# Patient Record
Sex: Male | Born: 1980 | Race: Black or African American | Hispanic: No | Marital: Married | Smoking: Never smoker
Health system: Southern US, Community
[De-identification: ages and names within clinical notes are randomized; demographics above are authoritative.]

## PROBLEM LIST (undated history)

## (undated) DIAGNOSIS — K5792 Diverticulitis of intestine, part unspecified, without perforation or abscess without bleeding: Secondary | ICD-10-CM

## (undated) HISTORY — PX: NOSE SURGERY: SHX723

---

## 2006-01-22 ENCOUNTER — Emergency Department: Payer: Self-pay | Admitting: Emergency Medicine

## 2006-01-22 ENCOUNTER — Other Ambulatory Visit: Payer: Self-pay

## 2006-11-03 ENCOUNTER — Emergency Department: Payer: Self-pay | Admitting: Unknown Physician Specialty

## 2007-08-11 ENCOUNTER — Emergency Department (HOSPITAL_COMMUNITY): Admission: EM | Admit: 2007-08-11 | Discharge: 2007-08-11 | Payer: Self-pay | Admitting: Emergency Medicine

## 2008-12-13 ENCOUNTER — Emergency Department: Payer: Self-pay | Admitting: Emergency Medicine

## 2009-04-14 ENCOUNTER — Emergency Department: Payer: Self-pay | Admitting: Emergency Medicine

## 2009-06-13 ENCOUNTER — Emergency Department: Payer: Self-pay | Admitting: Emergency Medicine

## 2009-08-16 ENCOUNTER — Emergency Department: Payer: Self-pay | Admitting: Emergency Medicine

## 2009-11-18 ENCOUNTER — Emergency Department: Payer: Self-pay | Admitting: Emergency Medicine

## 2010-02-02 ENCOUNTER — Emergency Department: Payer: Self-pay | Admitting: Internal Medicine

## 2010-06-12 ENCOUNTER — Emergency Department (HOSPITAL_COMMUNITY): Admission: EM | Admit: 2010-06-12 | Discharge: 2009-07-24 | Payer: Self-pay | Admitting: Emergency Medicine

## 2010-07-11 ENCOUNTER — Emergency Department: Payer: Self-pay | Admitting: Emergency Medicine

## 2011-01-30 ENCOUNTER — Emergency Department (HOSPITAL_COMMUNITY): Payer: Managed Care, Other (non HMO)

## 2011-01-30 ENCOUNTER — Emergency Department (HOSPITAL_COMMUNITY)
Admission: EM | Admit: 2011-01-30 | Discharge: 2011-01-30 | Disposition: A | Discharge status: Home / Self Care | Payer: Managed Care, Other (non HMO) | Attending: Emergency Medicine | Admitting: Emergency Medicine

## 2011-01-30 DIAGNOSIS — R002 Palpitations: Secondary | ICD-10-CM | POA: Insufficient documentation

## 2011-01-30 DIAGNOSIS — R0989 Other specified symptoms and signs involving the circulatory and respiratory systems: Secondary | ICD-10-CM | POA: Insufficient documentation

## 2011-01-30 DIAGNOSIS — E785 Hyperlipidemia, unspecified: Secondary | ICD-10-CM | POA: Insufficient documentation

## 2011-01-30 DIAGNOSIS — R0602 Shortness of breath: Secondary | ICD-10-CM | POA: Insufficient documentation

## 2011-01-30 DIAGNOSIS — R05 Cough: Secondary | ICD-10-CM | POA: Insufficient documentation

## 2011-01-30 DIAGNOSIS — R000 Tachycardia, unspecified: Secondary | ICD-10-CM | POA: Insufficient documentation

## 2011-01-30 DIAGNOSIS — R0609 Other forms of dyspnea: Secondary | ICD-10-CM | POA: Insufficient documentation

## 2011-01-30 DIAGNOSIS — Z79899 Other long term (current) drug therapy: Secondary | ICD-10-CM | POA: Insufficient documentation

## 2011-01-30 DIAGNOSIS — R059 Cough, unspecified: Secondary | ICD-10-CM | POA: Insufficient documentation

## 2011-01-30 DIAGNOSIS — F411 Generalized anxiety disorder: Secondary | ICD-10-CM | POA: Insufficient documentation

## 2011-01-30 LAB — POCT I-STAT, CHEM 8
BUN: 12 mg/dL (ref 6–23)
Chloride: 105 mEq/L (ref 96–112)
TCO2: 22 mmol/L (ref 0–100)

## 2011-01-30 LAB — DIFFERENTIAL
Eosinophils Relative: 2 % (ref 0–5)
Lymphocytes Relative: 46 % (ref 12–46)
Lymphs Abs: 2.5 10*3/uL (ref 0.7–4.0)
Neutrophils Relative %: 43 % (ref 43–77)

## 2011-01-30 LAB — CBC
Hemoglobin: 14 g/dL (ref 13.0–17.0)
MCV: 91.1 fL (ref 78.0–100.0)
Platelets: 227 10*3/uL (ref 150–400)
RBC: 4.26 MIL/uL (ref 4.22–5.81)
WBC: 5.5 10*3/uL (ref 4.0–10.5)

## 2011-01-30 LAB — TROPONIN I: Troponin I: <0.3 ng/mL (ref <0.30)

## 2011-03-27 LAB — POCT CARDIAC MARKERS
CKMB, poc: 2.4
Myoglobin, poc: 75.1
Myoglobin, poc: 83.3
Operator id: 294501
Troponin i, poc: <0.05

## 2011-03-27 LAB — DIFFERENTIAL
Monocytes Absolute: 0.5
Neutro Abs: 2
Neutrophils Relative %: 42 — ABNORMAL LOW

## 2011-03-27 LAB — CBC
HCT: 43.6
Platelets: 230
RDW: 12.1
WBC: 4.9

## 2011-03-27 LAB — POCT I-STAT CREATININE: Operator id: 272551

## 2011-03-27 LAB — I-STAT 8, (EC8 V) (CONVERTED LAB)
Chloride: 107
HCT: 46
Hemoglobin: 15.6
Operator id: 272551
Potassium: 3.7
TCO2: 26
pH, Ven: 7.367 — ABNORMAL HIGH

## 2011-03-27 LAB — D-DIMER, QUANTITATIVE: D-Dimer, Quant: <0.22

## 2011-05-15 ENCOUNTER — Emergency Department (HOSPITAL_COMMUNITY)
Admission: EM | Admit: 2011-05-15 | Discharge: 2011-05-15 | Disposition: A | Discharge status: Home / Self Care | Payer: Self-pay | Attending: Emergency Medicine | Admitting: Emergency Medicine

## 2011-05-15 ENCOUNTER — Other Ambulatory Visit: Payer: Self-pay

## 2011-05-15 ENCOUNTER — Emergency Department (HOSPITAL_COMMUNITY): Payer: Self-pay

## 2011-05-15 ENCOUNTER — Encounter: Payer: Self-pay | Admitting: Emergency Medicine

## 2011-05-15 DIAGNOSIS — R0789 Other chest pain: Secondary | ICD-10-CM

## 2011-05-15 DIAGNOSIS — R079 Chest pain, unspecified: Secondary | ICD-10-CM | POA: Insufficient documentation

## 2011-05-15 DIAGNOSIS — R42 Dizziness and giddiness: Secondary | ICD-10-CM | POA: Insufficient documentation

## 2011-05-15 LAB — CBC
Hemoglobin: 14.7 g/dL (ref 13.0–17.0)
MCH: 33.4 pg (ref 26.0–34.0)
MCV: 93.2 fL (ref 78.0–100.0)
RBC: 4.4 MIL/uL (ref 4.22–5.81)

## 2011-05-15 LAB — BASIC METABOLIC PANEL
CO2: 23 mEq/L (ref 19–32)
Calcium: 9.6 mg/dL (ref 8.4–10.5)
Creatinine, Ser: 0.74 mg/dL (ref 0.50–1.35)
Glucose, Bld: 94 mg/dL (ref 70–99)
Sodium: 134 mEq/L — ABNORMAL LOW (ref 135–145)

## 2011-05-15 LAB — CARDIAC PANEL(CRET KIN+CKTOT+MB+TROPI)
CK, MB: 2.4 ng/mL (ref 0.3–4.0)
CK, MB: 2.3 ng/mL (ref 0.3–4.0)
Total CK: 493 U/L — ABNORMAL HIGH (ref 7–232)

## 2011-05-15 MED ORDER — GI COCKTAIL ~~LOC~~
30.0000 mL | Freq: Once | ORAL | Status: AC
  Administered 2011-05-15: 30 mL via ORAL
  Filled 2011-05-15: qty 30

## 2011-05-15 NOTE — ED Notes (Signed)
MD at bedside. 

## 2011-05-15 NOTE — ED Provider Notes (Signed)
History     CSN: [id number] Arrival date & time: 05/15/2011 12:42 AM   First MD Initiated Contact with Patient 05/15/11 0120      Chief Complaint  Patient presents with  . Chest Pain    (Consider location/radiation/quality/duration/timing/severity/associated sxs/prior treatment) HPI 30 year old male presents to the emergency department with complaint of intermittent chest pressure since 4 PM today. Patient denies shortness of breath or nausea with the pain but does occasionally feel clammy and lightheaded. Symptoms last no more than 3-4 minutes at a time and then resolve. No associated activity with the pain no prior history of same. Patient denies any family history of cardiac disease. Patient is nonsmoker. No leg swelling tachycardia prolonged immobility.  No past medical history on file.  Past Surgical History  Procedure Date  . Nose surgery     History reviewed. No pertinent family history.  History  Substance Use Topics  . Smoking status: Never Smoker   . Smokeless tobacco: Not on file  . Alcohol Use: No      Review of Systems  All other systems reviewed and are negative.    Allergies  Ivp dye  Home Medications   Current Outpatient Rx  Name Route Sig Dispense Refill  . VITAMIN B-12 1000 MCG/15ML PO LIQD Oral Take 30 mLs by mouth once.      Carma Leaven M PLUS PO TABS Oral Take 1 tablet by mouth daily.        BP 143/80  Temp(Src) 98 F (36.7 C) (Oral)  Resp 20  SpO2 100%  Physical Exam  Nursing note and vitals reviewed. Constitutional: He is oriented to person, place, and time. He appears well-developed and well-nourished.  HENT:  Head: Normocephalic and atraumatic.  Right Ear: External ear normal.  Left Ear: External ear normal.  Nose: Nose normal.  Mouth/Throat: Oropharynx is clear and moist.  Eyes: Conjunctivae and EOM are normal. Pupils are equal, round, and reactive to light.  Neck: Normal range of motion. Neck supple. No JVD present. No tracheal  deviation present. No thyromegaly present.  Cardiovascular: Normal rate, regular rhythm, normal heart sounds and intact distal pulses.  Exam reveals no gallop and no friction rub.   No murmur heard. Pulmonary/Chest: Effort normal and breath sounds normal. No stridor. No respiratory distress. He has no wheezes. He has no rales. He exhibits no tenderness.  Abdominal: Soft. Bowel sounds are normal. He exhibits no distension and no mass. There is no tenderness. There is no rebound and no guarding.  Musculoskeletal: Normal range of motion. He exhibits no edema and no tenderness.  Lymphadenopathy:    He has no cervical adenopathy.  Neurological: He is oriented to person, place, and time. He has normal reflexes. No cranial nerve deficit. He exhibits normal muscle tone. Coordination normal.  Skin: Skin is dry. No rash noted. No erythema. No pallor.  Psychiatric: He has a normal mood and affect. His behavior is normal. Judgment and thought content normal.    ED Course  Procedures (including critical care time)  Labs Reviewed  BASIC METABOLIC PANEL - Abnormal; Notable for the following:    Sodium 134 (*)    All other components within normal limits  CARDIAC PANEL(CRET KIN+CKTOT+MB+TROPI) - Abnormal; Notable for the following:    Total CK 525 (*)    All other components within normal limits  CBC  CARDIAC PANEL(CRET KIN+CKTOT+MB+TROPI)   Dg Chest 2 View  05/15/2011  *RADIOLOGY REPORT*  Clinical Data: Chest pain  CHEST - 2 VIEW  Comparison: 01/30/2011  Findings: Normal heart size.  Clear lungs. Stable thoracic spine.  IMPRESSION: No active cardiopulmonary disease.  Original Report Authenticated By: Donavan Burnet, M.D.    Results for orders placed during the hospital encounter of 05/15/11  CBC      Component Value Range   WBC 5.5  4.0 - 10.5 (K/uL)   RBC 4.40  4.22 - 5.81 (MIL/uL)   Hemoglobin 14.7  13.0 - 17.0 (g/dL)   HCT 16.1  09.6 - 04.5 (%)   MCV 93.2  78.0 - 100.0 (fL)   MCH 33.4  26.0  - 34.0 (pg)   MCHC 35.9  30.0 - 36.0 (g/dL)   RDW 40.9  81.1 - 91.4 (%)   Platelets 253  150 - 400 (K/uL)  BASIC METABOLIC PANEL      Component Value Range   Sodium 134 (*) 135 - 145 (mEq/L)   Potassium 3.9  3.5 - 5.1 (mEq/L)   Chloride 100  96 - 112 (mEq/L)   CO2 23  19 - 32 (mEq/L)   Glucose, Bld 94  70 - 99 (mg/dL)   BUN 11  6 - 23 (mg/dL)   Creatinine, Ser 7.82  0.50 - 1.35 (mg/dL)   Calcium 9.6  8.4 - 95.6 (mg/dL)   GFR calc non Af Amer >90  >90 (mL/min)   GFR calc Af Amer >90  >90 (mL/min)  CARDIAC PANEL(CRET KIN+CKTOT+MB+TROPI)      Component Value Range   Total CK 525 (*) 7 - 232 (U/L)   CK, MB 2.4  0.3 - 4.0 (ng/mL)   Troponin I <0.30  <0.30 (ng/mL)   Relative Index 0.5  0.0 - 2.5   CARDIAC PANEL(CRET KIN+CKTOT+MB+TROPI)      Component Value Range   Total CK 493 (*) 7 - 232 (U/L)   CK, MB 2.3  0.3 - 4.0 (ng/mL)   Troponin I <0.30  <0.30 (ng/mL)   Relative Index 0.5  0.0 - 2.5    Dg Chest 2 View  05/15/2011  *RADIOLOGY REPORT*  Clinical Data: Chest pain  CHEST - 2 VIEW  Comparison: 01/30/2011  Findings: Normal heart size.  Clear lungs. Stable thoracic spine.  IMPRESSION: No active cardiopulmonary disease.  Original Report Authenticated By: Donavan Burnet, M.D.     Date: 05/15/2011  Rate: 71   Rhythm: normal sinus rhythm  QRS Axis: normal  Intervals: normal  ST/T Wave abnormalities: t wave inversions  Conduction Disutrbances:none  Narrative Interpretation: flipped t wave in III, AVF, unchanged from prior  Old EKG Reviewed: unchanged     MDM  30 year old male with one day of intermittent chest pain. No EKG changes, elevated total CK but negative MB and troponin. Discuss with on-call cardiology who does not feel that this is indication for admission. Patient's story seems atypical for cardiac disease. Will provide him with primary care physician information for further followup        Olivia Mackie, MD 05/15/11 [phone]

## 2011-05-15 NOTE — ED Notes (Signed)
Pt states he has been having chest pain off and on all day  Pt states it is a heaviness and feels like someone is pressing on his chest  Pt states the pain comes and goes  Pt states earlier he had some light headedness and a cold clammy sweat with the pain

## 2011-05-15 NOTE — ED Notes (Signed)
Patient states that he has had intermittent chest pain since 330pm. States that he went to an EMS substation and they did an EKG which they told him was negative but advised him that he may need to have blood work done. Patient states that pain has persisted intermittently since EKG was done. States that pain gets worse with activity and subsides with rest. Patient indicates left side chest pain. States "it feels like the pain is behind my heart." Patient denies chest pain at this time.

## 2011-05-15 NOTE — ED Notes (Signed)
Patient ambulated to bathroom; gait steady. Patient denies chest pain after ambulation.

## 2011-05-24 ENCOUNTER — Emergency Department: Payer: Self-pay | Admitting: Emergency Medicine

## 2011-06-04 ENCOUNTER — Emergency Department: Payer: Self-pay | Admitting: Emergency Medicine

## 2011-12-02 ENCOUNTER — Emergency Department: Payer: Self-pay | Admitting: Emergency Medicine

## 2011-12-02 LAB — URINALYSIS, COMPLETE
Bacteria: NONE SEEN
Bilirubin,UR: NEGATIVE
Blood: NEGATIVE
Glucose,UR: NEGATIVE mg/dL (ref 0–75)
Leukocyte Esterase: NEGATIVE
Ph: 6 (ref 4.5–8.0)
Specific Gravity: 1.018 (ref 1.003–1.030)
Squamous Epithelial: <1
WBC UR: NONE SEEN /HPF (ref 0–5)

## 2011-12-02 LAB — DRUG SCREEN, URINE
Cocaine Metabolite,Ur ~~LOC~~: NEGATIVE (ref ?–300)
Opiate, Ur Screen: NEGATIVE (ref ?–300)
Phencyclidine (PCP) Ur S: NEGATIVE (ref ?–25)
Tricyclic, Ur Screen: NEGATIVE (ref ?–1000)

## 2011-12-02 LAB — COMPREHENSIVE METABOLIC PANEL
Anion Gap: 9 (ref 7–16)
Calcium, Total: 8.7 mg/dL (ref 8.5–10.1)
EGFR (African American): >60
Glucose: 105 mg/dL — ABNORMAL HIGH (ref 65–99)
Osmolality: 279 (ref 275–301)
Potassium: 3.9 mmol/L (ref 3.5–5.1)
SGPT (ALT): 53 U/L
Sodium: 140 mmol/L (ref 136–145)

## 2011-12-02 LAB — CBC
HCT: 40.3 % (ref 40.0–52.0)
HGB: 14.1 g/dL (ref 13.0–18.0)
MCH: 33.3 pg (ref 26.0–34.0)
MCHC: 35.1 g/dL (ref 32.0–36.0)
Platelet: 224 10*3/uL (ref 150–440)
RBC: 4.24 10*6/uL — ABNORMAL LOW (ref 4.40–5.90)
RDW: 12.5 % (ref 11.5–14.5)
WBC: 4.2 10*3/uL (ref 3.8–10.6)

## 2011-12-02 LAB — CK TOTAL AND CKMB (NOT AT ARMC)
CK, Total: 989 U/L — ABNORMAL HIGH (ref 35–232)
CK-MB: 1.5 ng/mL (ref 0.5–3.6)

## 2012-06-16 IMAGING — CT CT ABD-PELV W/O CM
1 of 2 series · 15 of 32 positions shown, 20 images · non-contrast
Comparison: none

REASON FOR EXAM: (1) RLQ pain, vomiting and fever; (2) RLQ pain, vomiting
and fever;    NOTE: Essie
COMMENTS:

PROCEDURE:     CT  - CT ABDOMEN AND PELVIS W[DATE]  [DATE]
RESULT:
TECHNIQUE: Helical noncontrast 3 mm sections were obtained from the lung
bases through the pubic symphysis.

[Series 2: 3mm soft tissue · axial · 0.83mm/px · z∈[-400,+80]mm · 15 of 176 slices shown, 20 images]
[im 8/176  soft-tissue]
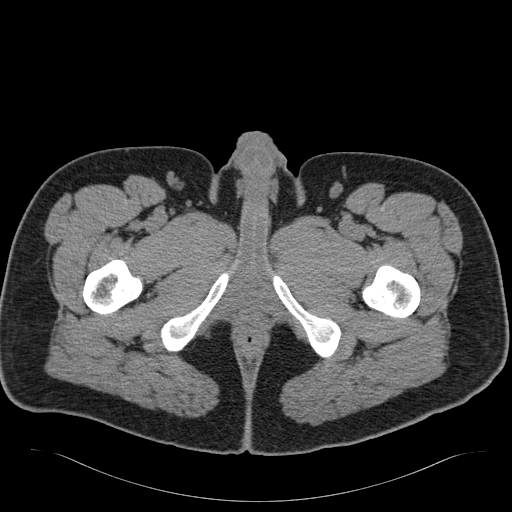
[im 8/176  bone]
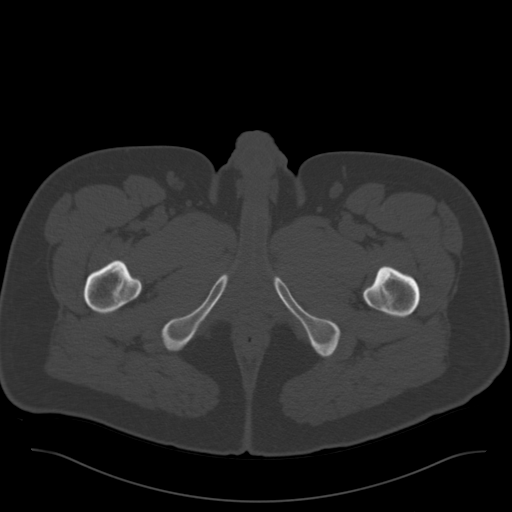
[im 23/176  soft-tissue]
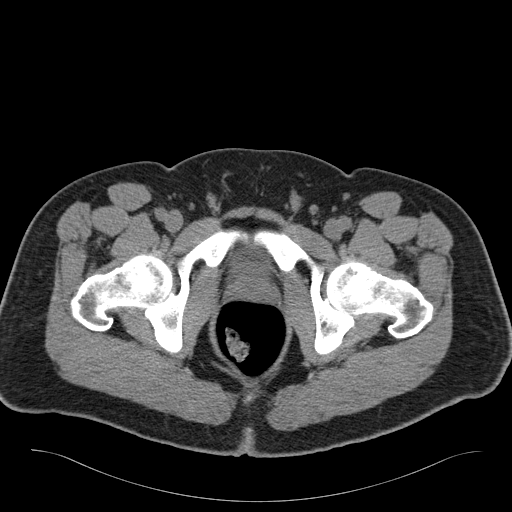
[im 31/176  soft-tissue]
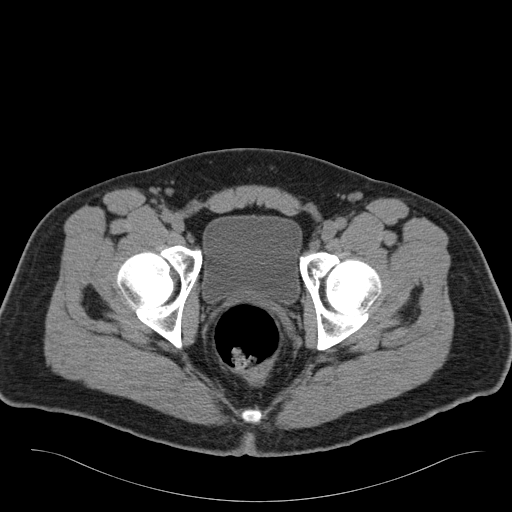
[im 46/176  soft-tissue]
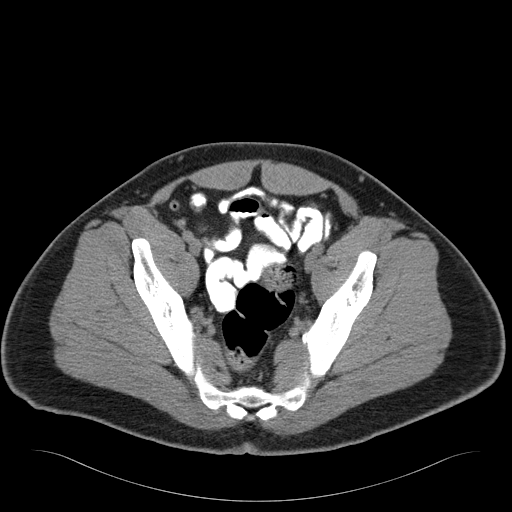
[im 61/176  soft-tissue]
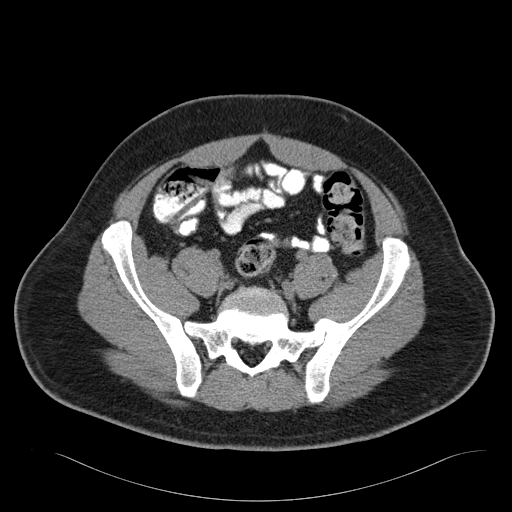
[im 69/176  soft-tissue]
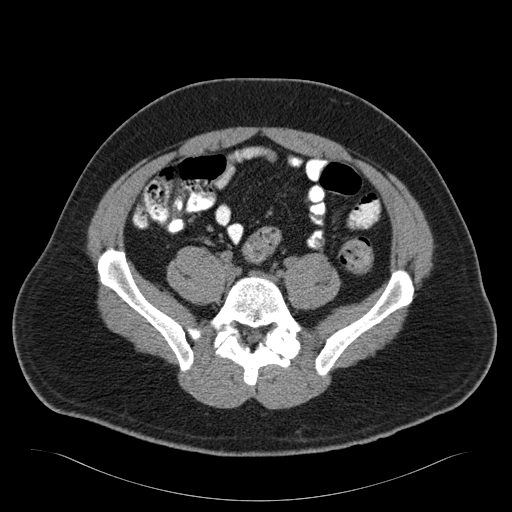
[im 84/176  soft-tissue]
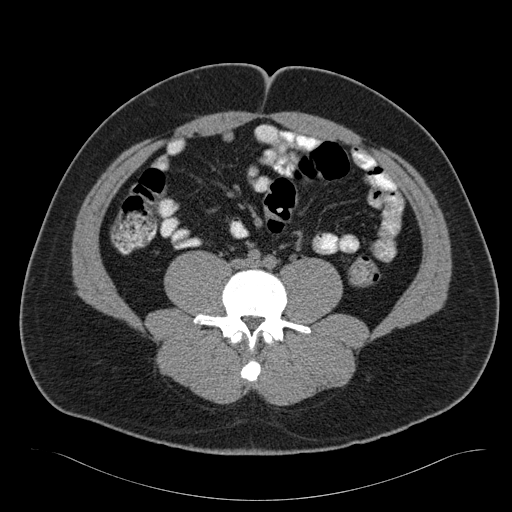
[im 92/176  soft-tissue]
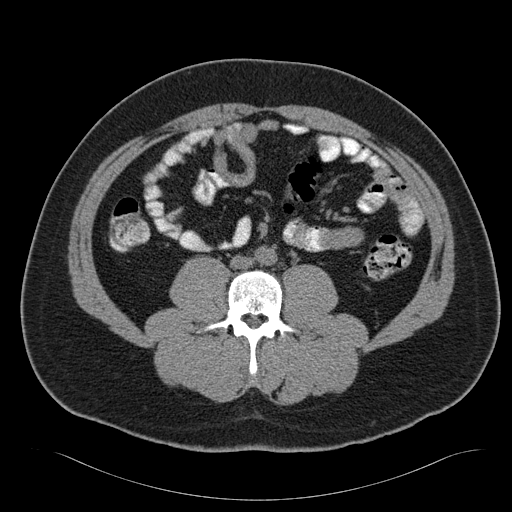
[im 107/176  soft-tissue]
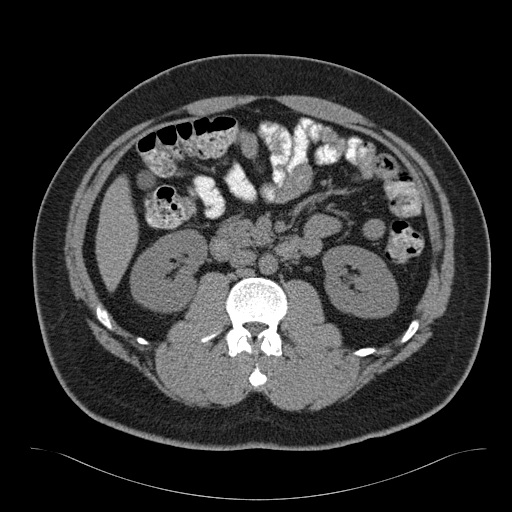
[im 107/176  bone]
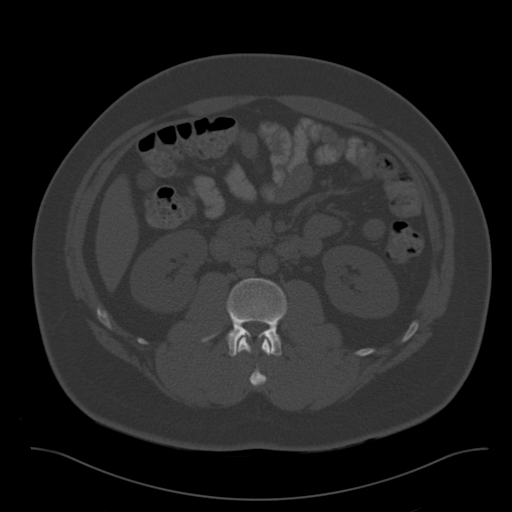
[im 115/176  soft-tissue]
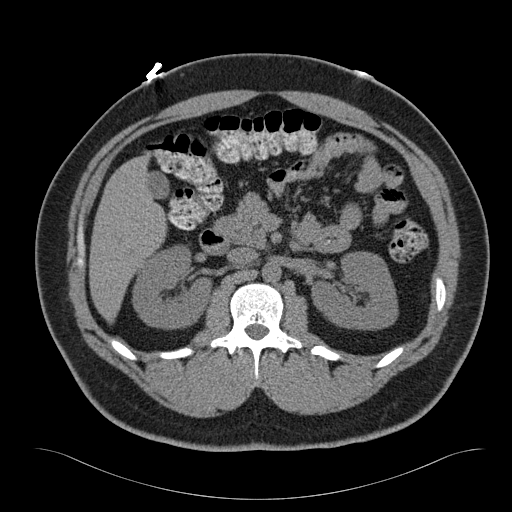
[im 130/176  soft-tissue]
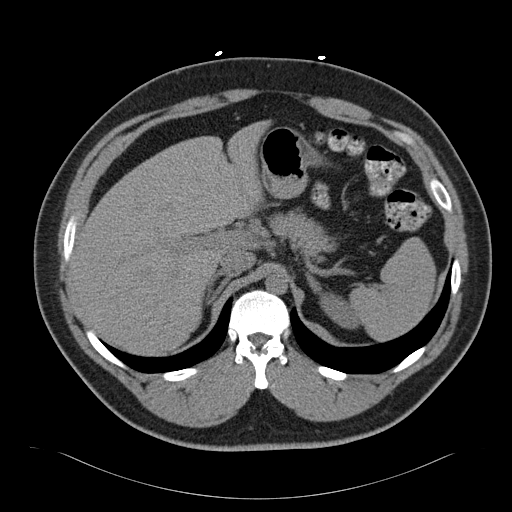
[im 145/176  soft-tissue]
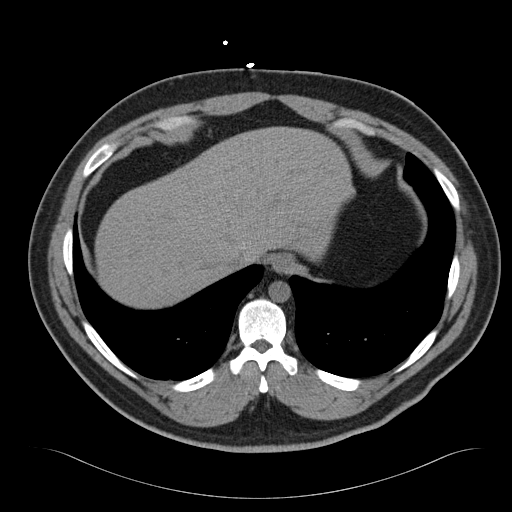
[im 145/176  lung]
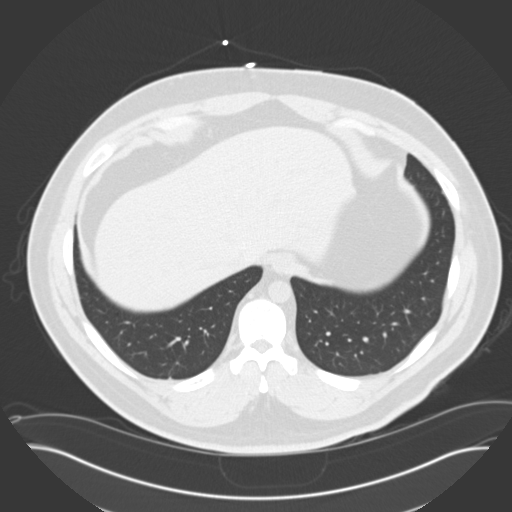
[im 153/176  soft-tissue]
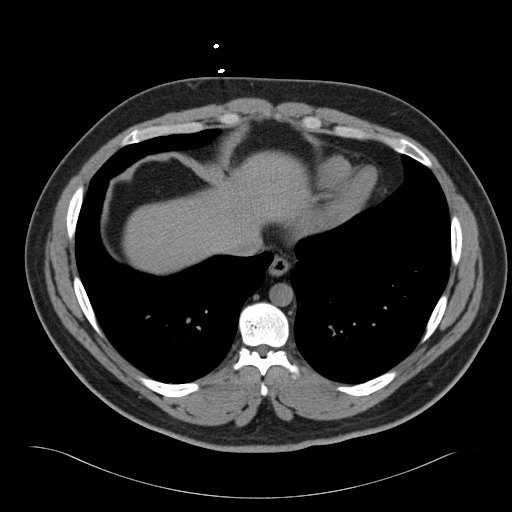
[im 153/176  lung]
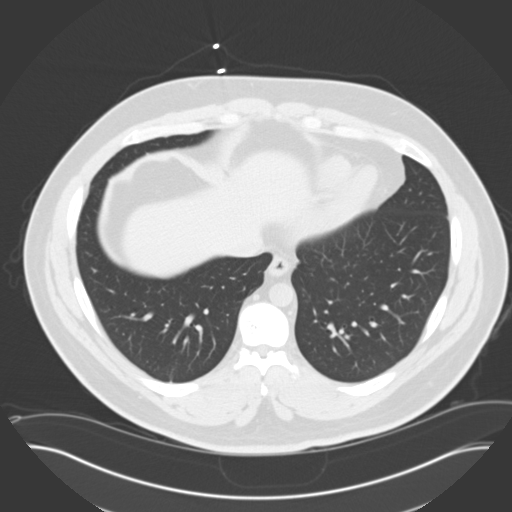
[im 160/176  lung]
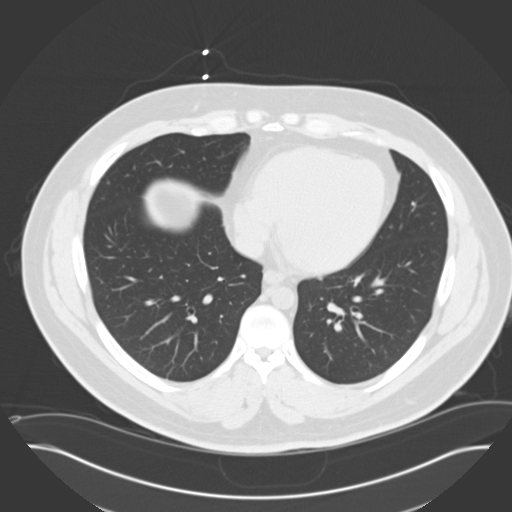
[im 168/176  soft-tissue]
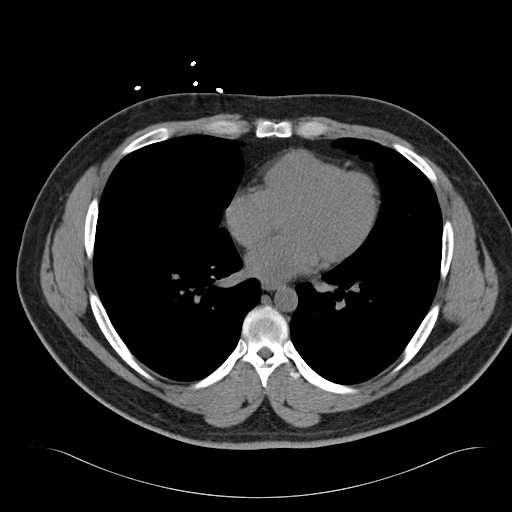
[im 168/176  lung]
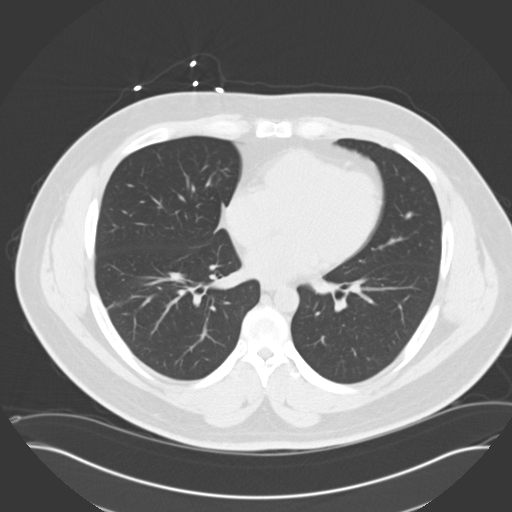

[15 of 32 positions shown; findings below may reference images not displayed]

FINDINGS: The lung bases are unremarkable.

The liver, spleen, adrenals, pancreas and kidneys are unremarkable. There is
no evidence of abdominal free fluid, loculated fluid collections, masses or
adenopathy. The appendix is appreciated and is unremarkable.
IMPRESSION: 1.     No CT evidence of obstructive or inflammatory abnormalities.
2.     Dr. Psit[place]otopoula-Gr of the Emergency Department was informed of these findings
via a preliminary faxed report.

## 2019-12-28 ENCOUNTER — Ambulatory Visit
Admission: EM | Admit: 2019-12-28 | Discharge: 2019-12-28 | Disposition: A | Discharge status: Home / Self Care | Payer: Self-pay | Attending: Emergency Medicine | Admitting: Emergency Medicine

## 2019-12-28 ENCOUNTER — Other Ambulatory Visit: Payer: Self-pay

## 2019-12-28 DIAGNOSIS — R1012 Left upper quadrant pain: Secondary | ICD-10-CM

## 2019-12-28 LAB — POCT URINALYSIS DIP (MANUAL ENTRY)
Bilirubin, UA: NEGATIVE
Glucose, UA: NEGATIVE mg/dL
Ketones, POC UA: NEGATIVE mg/dL
Leukocytes, UA: NEGATIVE
Nitrite, UA: NEGATIVE
Protein Ur, POC: NEGATIVE mg/dL
Spec Grav, UA: 1.015 (ref 1.010–1.025)
Urobilinogen, UA: 0.2 E.U./dL
pH, UA: 7 (ref 5.0–8.0)

## 2019-12-28 NOTE — ED Triage Notes (Signed)
Pt c/o constant center lower abdominal pain and pressure since yesterday around 6pm. States developed chills last night with no appetite. Denies n/v/d

## 2019-12-28 NOTE — Discharge Instructions (Signed)
Will call with lab results tomorrow morning. In the meantime, please go to ER for worsening pain, vomiting, fever, blood in stool/urine.

## 2019-12-28 NOTE — ED Provider Notes (Signed)
[facility]    CSN: [id number] Arrival date & time: 12/28/19  1641      History   Chief Complaint Chief Complaint  Patient presents with  . Abdominal Pain    HPI Caleb Manning is a 39 y.o. male that significant medical history presenting for constant, lower abdominal pain with pressure since yesterday.  States it began around 6 PM.  Also developed chills later that night and has had decreased appetite.  No nausea, vomiting, diarrhea.  No fever, blood in stool, melena, diarrhea, change in urination.  No penile or testicular pain or swelling.  Denies cough, difficulty breathing, chest pain.  No history of diverticulosis/diverticulitis.  No rectal pain.  Last bowel movement this morning: Normal for him.  Denies abdominal surgery history.  No easy bruising or bleeding.  History reviewed. No pertinent past medical history.  There are no problems to display for this patient.   Past Surgical History:  Procedure Laterality Date  . NOSE SURGERY         Home Medications    Prior to Admission medications   Medication Sig Start Date End Date Taking? Authorizing Provider  Cyanocobalamin (VITAMIN B-12) 1000 MCG/15ML LIQD Take 30 mLs by mouth once.      [provider]  Multiple Vitamins-Minerals (MULTIVITAMINS THER. W/MINERALS) TABS Take 1 tablet by mouth daily.      [provider]    Family History History reviewed. No pertinent family history.  Social History Social History   Tobacco Use  . Smoking status: Never Smoker  . Smokeless tobacco: Never Used  Substance Use Topics  . Alcohol use: No  . Drug use: No     Allergies   Ivp dye [iodinated diagnostic agents]   Review of Systems As per HPI   Physical Exam Triage Vital Signs ED Triage Vitals  Enc Vitals Group     BP      Pulse      Resp      Temp      Temp src      SpO2      Weight      Height      Head Circumference      Peak Flow      Pain Score      Pain Loc       Pain Edu?      Excl. in [place]?    No data found.  Updated Vital Signs BP (!) 138/92 (BP Location: Left Arm)   Pulse (!) 106   Temp 99.4 F (37.4 C) (Oral)   Resp 18   SpO2 98%   Visual Acuity Right Eye Distance:   Left Eye Distance:   Bilateral Distance:    Right Eye Near:   Left Eye Near:    Bilateral Near:     Physical Exam Constitutional:      General: He is not in acute distress.    Appearance: He is well-developed. He is obese. He is not ill-appearing.  HENT:     Head: Normocephalic and atraumatic.  Eyes:     General: No scleral icterus.    Pupils: Pupils are equal, round, and reactive to light.  Cardiovascular:     Rate and Rhythm: Normal rate and regular rhythm.  Pulmonary:     Effort: Pulmonary effort is normal. No respiratory distress.     Breath sounds: No wheezing.  Abdominal:     General: Bowel sounds are normal. There is no distension or  abdominal bruit.     Palpations: Abdomen is soft. There is no hepatomegaly or splenomegaly.     Tenderness: There is abdominal tenderness in the left upper quadrant. There is no right CVA tenderness, left CVA tenderness, guarding or rebound. Negative signs include Murphy's sign, Rovsing's sign and McBurney's sign.  Skin:    General: Skin is warm.     Capillary Refill: Capillary refill takes less than 2 seconds.     Coloration: Skin is not cyanotic, jaundiced, mottled or pale.     Findings: No erythema or rash.  Neurological:     General: No focal deficit present.     Mental Status: He is alert and oriented to person, place, and time.      UC Treatments / Results  Labs (all labs ordered are listed, but only abnormal results are displayed) Labs Reviewed  POCT URINALYSIS DIP (MANUAL ENTRY) - Abnormal; Notable for the following components:      Result Value   Blood, UA small (*)    All other components within normal limits  AMYLASE  LIPID PANEL  COMPREHENSIVE METABOLIC PANEL  CBC WITH DIFFERENTIAL/PLATELET     EKG   Radiology No results found.  Procedures Procedures (including critical care time)  Medications Ordered in UC Medications - No data to display  Initial Impression / Assessment and Plan / UC Course  I have reviewed the triage vital signs and the nursing notes.  Pertinent labs & imaging results that were available during my care of the patient were reviewed by me and considered in my medical decision making (see chart for details).     Patient afebrile, nontoxic in office today.  Patient does have LUQ tenderness, though no history of colitis, diverticulosis/diverticulitis.  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  No signs of acute abdomen; will obtain basic lab screening for infectious process, hepatic or renal injury, pancreatic injury.  ER return precautions discussed, patient verbalized understanding and is agreeable to plan. Final Clinical Impressions(s) / UC Diagnoses   Final diagnoses:  Acute LUQ pain     Discharge Instructions     Will call with lab results tomorrow morning. In the meantime, please go to ER for worsening pain, vomiting, fever, blood in stool/urine.    ED Prescriptions    None     PDMP not reviewed this encounter.   Hall-Potvin, [place], [place] 12/28/19 1958

## 2019-12-29 ENCOUNTER — Emergency Department (HOSPITAL_COMMUNITY)
Admission: EM | Admit: 2019-12-29 | Discharge: 2019-12-30 | Disposition: A | Discharge status: Home / Self Care | Payer: Self-pay | Attending: Emergency Medicine | Admitting: Emergency Medicine

## 2019-12-29 ENCOUNTER — Other Ambulatory Visit: Payer: Self-pay

## 2019-12-29 DIAGNOSIS — Z5321 Procedure and treatment not carried out due to patient leaving prior to being seen by health care provider: Secondary | ICD-10-CM | POA: Insufficient documentation

## 2019-12-29 DIAGNOSIS — R509 Fever, unspecified: Secondary | ICD-10-CM | POA: Insufficient documentation

## 2019-12-29 DIAGNOSIS — R350 Frequency of micturition: Secondary | ICD-10-CM | POA: Insufficient documentation

## 2019-12-29 DIAGNOSIS — R1030 Lower abdominal pain, unspecified: Secondary | ICD-10-CM | POA: Insufficient documentation

## 2019-12-29 LAB — COMPREHENSIVE METABOLIC PANEL
ALT: 36 IU/L (ref 0–44)
ALT: 54 U/L — ABNORMAL HIGH (ref 0–44)
AST: 23 IU/L (ref 0–40)
AST: 37 U/L (ref 15–41)
Albumin/Globulin Ratio: 1.4 (ref 1.2–2.2)
Albumin: 4.6 g/dL (ref 4.0–5.0)
Albumin: 4.2 g/dL (ref 3.5–5.0)
Alkaline Phosphatase: 72 IU/L (ref 48–121)
Alkaline Phosphatase: 60 U/L (ref 38–126)
Anion gap: 11 (ref 5–15)
BUN/Creatinine Ratio: 8 — ABNORMAL LOW (ref 9–20)
BUN: 8 mg/dL (ref 6–24)
BUN: 8 mg/dL (ref 6–20)
Bilirubin Total: 1.1 mg/dL (ref 0.0–1.2)
CO2: 22 mmol/L (ref 20–29)
CO2: 24 mmol/L (ref 22–32)
Calcium: 9.5 mg/dL (ref 8.7–10.2)
Calcium: 9.3 mg/dL (ref 8.9–10.3)
Chloride: 100 mmol/L (ref 96–106)
Chloride: 101 mmol/L (ref 98–111)
Creatinine, Ser: 0.96 mg/dL (ref 0.76–1.27)
Creatinine, Ser: 1.07 mg/dL (ref 0.61–1.24)
GFR calc Af Amer: 114 mL/min/{1.73_m2} (ref >59)
GFR calc Af Amer: >60 mL/min (ref >60)
GFR calc non Af Amer: 98 mL/min/{1.73_m2} (ref >59)
GFR calc non Af Amer: >60 mL/min (ref >60)
Globulin, Total: 3.3 g/dL (ref 1.5–4.5)
Glucose, Bld: 128 mg/dL — ABNORMAL HIGH (ref 70–99)
Glucose: 85 mg/dL (ref 65–99)
Potassium: 4.2 mmol/L (ref 3.5–5.2)
Potassium: 4 mmol/L (ref 3.5–5.1)
Sodium: 137 mmol/L (ref 134–144)
Sodium: 136 mmol/L (ref 135–145)
Total Bilirubin: 1.8 mg/dL — ABNORMAL HIGH (ref 0.3–1.2)
Total Protein: 7.9 g/dL (ref 6.0–8.5)
Total Protein: 7.5 g/dL (ref 6.5–8.1)

## 2019-12-29 LAB — URINALYSIS, ROUTINE W REFLEX MICROSCOPIC
Bacteria, UA: NONE SEEN
Bilirubin Urine: NEGATIVE
Glucose, UA: NEGATIVE mg/dL
Ketones, ur: NEGATIVE mg/dL
Leukocytes,Ua: NEGATIVE
Nitrite: NEGATIVE
Protein, ur: NEGATIVE mg/dL
Specific Gravity, Urine: 1.008 (ref 1.005–1.030)
pH: 8 (ref 5.0–8.0)

## 2019-12-29 LAB — LIPID PANEL
Chol/HDL Ratio: 5 ratio (ref 0.0–5.0)
Cholesterol, Total: 269 mg/dL — ABNORMAL HIGH (ref 100–199)
HDL: 54 mg/dL (ref >39)
LDL Chol Calc (NIH): 193 mg/dL — ABNORMAL HIGH (ref 0–99)
Triglycerides: 122 mg/dL (ref 0–149)
VLDL Cholesterol Cal: 22 mg/dL (ref 5–40)

## 2019-12-29 LAB — CBC WITH DIFFERENTIAL/PLATELET
Basophils Absolute: 0.1 10*3/uL (ref 0.0–0.2)
Basos: 1 %
EOS (ABSOLUTE): 0.1 10*3/uL (ref 0.0–0.4)
Eos: 1 %
Hematocrit: 44 % (ref 37.5–51.0)
Hemoglobin: 15.6 g/dL (ref 13.0–17.7)
Immature Grans (Abs): 0 10*3/uL (ref 0.0–0.1)
Immature Granulocytes: 0 %
Lymphocytes Absolute: 1.7 10*3/uL (ref 0.7–3.1)
Lymphs: 14 %
MCH: 33.8 pg — ABNORMAL HIGH (ref 26.6–33.0)
MCHC: 35.5 g/dL (ref 31.5–35.7)
MCV: 95 fL (ref 79–97)
Monocytes Absolute: 1.1 10*3/uL — ABNORMAL HIGH (ref 0.1–0.9)
Monocytes: 9 %
Neutrophils Absolute: 9.5 10*3/uL — ABNORMAL HIGH (ref 1.4–7.0)
Neutrophils: 75 %
Platelets: 251 10*3/uL (ref 150–450)
RBC: 4.62 x10E6/uL (ref 4.14–5.80)
RDW: 12.2 % (ref 11.6–15.4)
WBC: 12.5 10*3/uL — ABNORMAL HIGH (ref 3.4–10.8)

## 2019-12-29 LAB — CBC
HCT: 43 % (ref 39.0–52.0)
Hemoglobin: 15.1 g/dL (ref 13.0–17.0)
MCH: 33.5 pg (ref 26.0–34.0)
MCHC: 35.1 g/dL (ref 30.0–36.0)
MCV: 95.3 fL (ref 80.0–100.0)
Platelets: 246 10*3/uL (ref 150–400)
RBC: 4.51 MIL/uL (ref 4.22–5.81)
RDW: 11.7 % (ref 11.5–15.5)
WBC: 12.3 10*3/uL — ABNORMAL HIGH (ref 4.0–10.5)
nRBC: 0 % (ref 0.0–0.2)

## 2019-12-29 LAB — AMYLASE: Amylase: 71 U/L (ref 31–110)

## 2019-12-29 LAB — LIPASE, BLOOD: Lipase: 23 U/L (ref 11–51)

## 2019-12-29 MED ORDER — SODIUM CHLORIDE 0.9% FLUSH: 3.0000 mL | Freq: Once | INTRAVENOUS | Status: DC

## 2019-12-29 NOTE — ED Triage Notes (Signed)
Pt here from UC with lower abd pain/pressure and chills X2 days. Endorses fevers. Endorses frequent urination. Pt sent here to rule out appendicitis.

## 2019-12-30 ENCOUNTER — Emergency Department: Payer: BLUE CROSS/BLUE SHIELD

## 2019-12-30 ENCOUNTER — Other Ambulatory Visit: Payer: Self-pay

## 2019-12-30 ENCOUNTER — Encounter: Payer: Self-pay | Admitting: Intensive Care

## 2019-12-30 ENCOUNTER — Inpatient Hospital Stay
Admission: EM | Admit: 2019-12-30 | Discharge: 2020-01-01 | DRG: 392 | Disposition: A | Discharge status: Home / Self Care | Payer: BLUE CROSS/BLUE SHIELD | Attending: General Surgery | Admitting: General Surgery

## 2019-12-30 DIAGNOSIS — Z20822 Contact with and (suspected) exposure to covid-19: Secondary | ICD-10-CM | POA: Diagnosis present

## 2019-12-30 DIAGNOSIS — K5720 Diverticulitis of large intestine with perforation and abscess without bleeding: Secondary | ICD-10-CM

## 2019-12-30 DIAGNOSIS — D72829 Elevated white blood cell count, unspecified: Secondary | ICD-10-CM | POA: Diagnosis present

## 2019-12-30 DIAGNOSIS — K5792 Diverticulitis of intestine, part unspecified, without perforation or abscess without bleeding: Principal | ICD-10-CM | POA: Diagnosis present

## 2019-12-30 LAB — HIV ANTIBODY (ROUTINE TESTING W REFLEX): HIV Screen 4th Generation wRfx: NONREACTIVE

## 2019-12-30 LAB — URINALYSIS, COMPLETE (UACMP) WITH MICROSCOPIC
Bacteria, UA: NONE SEEN
Bilirubin Urine: NEGATIVE
Glucose, UA: NEGATIVE mg/dL
Ketones, ur: NEGATIVE mg/dL
Leukocytes,Ua: NEGATIVE
Nitrite: NEGATIVE
Protein, ur: NEGATIVE mg/dL
Specific Gravity, Urine: 1.008 (ref 1.005–1.030)
Squamous Epithelial / HPF: NONE SEEN (ref 0–5)
pH: 7 (ref 5.0–8.0)

## 2019-12-30 LAB — COMPREHENSIVE METABOLIC PANEL
ALT: 84 U/L — ABNORMAL HIGH (ref 0–44)
AST: 57 U/L — ABNORMAL HIGH (ref 15–41)
Albumin: 4.5 g/dL (ref 3.5–5.0)
Alkaline Phosphatase: 70 U/L (ref 38–126)
Anion gap: 11 (ref 5–15)
BUN: 11 mg/dL (ref 6–20)
CO2: 22 mmol/L (ref 22–32)
Calcium: 8.8 mg/dL — ABNORMAL LOW (ref 8.9–10.3)
Chloride: 100 mmol/L (ref 98–111)
Creatinine, Ser: 1.11 mg/dL (ref 0.61–1.24)
GFR calc Af Amer: >60 mL/min (ref >60)
GFR calc non Af Amer: >60 mL/min (ref >60)
Glucose, Bld: 137 mg/dL — ABNORMAL HIGH (ref 70–99)
Potassium: 4 mmol/L (ref 3.5–5.1)
Sodium: 133 mmol/L — ABNORMAL LOW (ref 135–145)
Total Bilirubin: 3 mg/dL — ABNORMAL HIGH (ref 0.3–1.2)
Total Protein: 8.4 g/dL — ABNORMAL HIGH (ref 6.5–8.1)

## 2019-12-30 LAB — CBC
HCT: 40.8 % (ref 39.0–52.0)
Hemoglobin: 14.8 g/dL (ref 13.0–17.0)
MCH: 33.6 pg (ref 26.0–34.0)
MCHC: 36.3 g/dL — ABNORMAL HIGH (ref 30.0–36.0)
MCV: 92.5 fL (ref 80.0–100.0)
Platelets: 244 10*3/uL (ref 150–400)
RBC: 4.41 MIL/uL (ref 4.22–5.81)
RDW: 11.9 % (ref 11.5–15.5)
WBC: 15.9 10*3/uL — ABNORMAL HIGH (ref 4.0–10.5)
nRBC: 0 % (ref 0.0–0.2)

## 2019-12-30 LAB — SARS CORONAVIRUS 2 BY RT PCR (HOSPITAL ORDER, PERFORMED IN ~~LOC~~ HOSPITAL LAB): SARS Coronavirus 2: NEGATIVE

## 2019-12-30 LAB — LIPASE, BLOOD: Lipase: 20 U/L (ref 11–51)

## 2019-12-30 MED ORDER — PIPERACILLIN-TAZOBACTAM 3.375 G IVPB: 3.3750 g | Freq: Three times a day (TID) | INTRAVENOUS | Status: DC

## 2019-12-30 MED ORDER — HYDROCODONE-ACETAMINOPHEN 5-325 MG PO TABS
1.0000 | ORAL_TABLET | ORAL | Status: DC | PRN
  Filled 2019-12-30: qty 2

## 2019-12-30 MED ORDER — ONDANSETRON HCL 4 MG/2ML IJ SOLN: 4.0000 mg | Freq: Four times a day (QID) | INTRAMUSCULAR | Status: DC | PRN

## 2019-12-30 MED ORDER — PIPERACILLIN-TAZOBACTAM 3.375 G IVPB
3.3750 g | Freq: Three times a day (TID) | INTRAVENOUS | Status: DC
  Administered 2019-12-30 – 2020-01-01 (×5): 3.375 g via INTRAVENOUS
  Filled 2019-12-30 (×5): qty 50

## 2019-12-30 MED ORDER — ACETAMINOPHEN 500 MG PO TABS
1000.0000 mg | ORAL_TABLET | Freq: Four times a day (QID) | ORAL | Status: DC | PRN
  Administered 2019-12-30 – 2019-12-31 (×2): 1000 mg via ORAL
  Filled 2019-12-30 (×2): qty 2

## 2019-12-30 MED ORDER — ENOXAPARIN SODIUM 40 MG/0.4ML ~~LOC~~ SOLN
40.0000 mg | SUBCUTANEOUS | Status: DC
  Administered 2019-12-30 – 2019-12-31 (×2): 40 mg via SUBCUTANEOUS
  Filled 2019-12-30 (×3): qty 0.4

## 2019-12-30 MED ORDER — SODIUM CHLORIDE 0.9 % IV SOLN: INTRAVENOUS | Status: DC

## 2019-12-30 MED ORDER — MORPHINE SULFATE (PF) 4 MG/ML IV SOLN
4.0000 mg | INTRAVENOUS | Status: DC | PRN
  Filled 2019-12-30: qty 1

## 2019-12-30 MED ORDER — PIPERACILLIN-TAZOBACTAM 3.375 G IVPB 30 MIN
3.3750 g | Freq: Once | INTRAVENOUS | Status: AC
  Administered 2019-12-30: 3.375 g via INTRAVENOUS
  Filled 2019-12-30: qty 50

## 2019-12-30 MED ORDER — ONDANSETRON 4 MG PO TBDP: 4.0000 mg | ORAL_TABLET | Freq: Four times a day (QID) | ORAL | Status: DC | PRN

## 2019-12-30 NOTE — ED Provider Notes (Signed)
[facility] Emergency Department Provider Note  Time seen: 9:22 AM  I have reviewed the triage vital signs and the nursing notes.   HISTORY  Chief Complaint Abdominal Pain   HPI Caleb Manning is a 39 y.o. male with no past medical history presents to the emergency department for lower abdominal pain and reported fever.  According to the patient for the past 3 days he has been experiencing lower abdominal pain below his bellybutton.  States he has been experiencing fever at home as high as 102.  Patient went to urgent care and had lab work done that showed no concerning findings per patient but he was sent to the emergency department for CT scan.  He states shortly after arriving to the emergency department his babysitter called and had to leave abruptly so he had to leave the emergency department to take care of his children.  Here patient states continued lower abdominal discomfort currently afebrile.  Rates the pain is a 6/10 dull pain across the lower abdomen right and left-sided.  Denies dysuria or hematuria.  No nausea vomiting diarrhea.  No cough or shortness of breath.   History reviewed. No pertinent past medical history.  There are no problems to display for this patient.   Past Surgical History:  Procedure Laterality Date  . NOSE SURGERY      Prior to Admission medications   Medication Sig Start Date End Date Taking? Authorizing Provider  Cyanocobalamin (VITAMIN B-12) 1000 MCG/15ML LIQD Take 30 mLs by mouth once.      [provider]  Multiple Vitamins-Minerals (MULTIVITAMINS THER. W/MINERALS) TABS Take 1 tablet by mouth daily.      [provider]    Allergies  Allergen Reactions  . Ivp Dye [Iodinated Diagnostic Agents] Shortness Of Breath    History reviewed. No pertinent family history.  Social History Social History   Tobacco Use  . Smoking status: Never Smoker  . Smokeless tobacco: Never Used  Substance Use  Topics  . Alcohol use: No  . Drug use: No    Review of Systems Constitutional: Negative for fever. Cardiovascular: Negative for chest pain. Respiratory: Negative for shortness of breath. Gastrointestinal: Positive for abdominal pain more so in the lower abdomen.  Negative for nausea vomiting or diarrhea. Genitourinary: Negative for urinary compaints Musculoskeletal: Negative for musculoskeletal complaints Neurological: Negative for headache All other ROS negative  ____________________________________________   PHYSICAL EXAM:  VITAL SIGNS: ED Triage Vitals  Enc Vitals Group     BP 12/30/19 0854 (!) 149/95     Pulse Rate 12/30/19 0854 (!) 105     Resp 12/30/19 0854 16     Temp 12/30/19 0854 98.8 F (37.1 C)     Temp Source 12/30/19 0854 Oral     SpO2 12/30/19 0854 98 %     Weight 12/30/19 0855 255 lb (115.7 kg)     Height 12/30/19 0855 6\' 2"  (1.88 m)     Head Circumference --      Peak Flow --      Pain Score 12/30/19 0855 6     Pain Loc --      Pain Edu? --      Excl. in [place]? --    Constitutional: Alert and oriented. Well appearing and in no distress. Eyes: Normal exam ENT      Head: Normocephalic and atraumatic.      Mouth/Throat: Mucous membranes are moist. Cardiovascular: Normal rate, regular rhythm.  Respiratory: Normal respiratory effort  without tachypnea nor retractions. Breath sounds are clear  Gastrointestinal: Soft, moderate right lower quadrant as well as left lower quadrant tenderness palpation with mild left upper quadrant tenderness.  No rebound guarding or distention. Musculoskeletal: Nontender with normal range of motion in all extremities.  Neurologic:  Normal speech and language. No gross focal neurologic deficit Skin:  Skin is warm, dry and intact.  Psychiatric: Mood and affect are normal.   ____________________________________________    RADIOLOGY  CT consistent with diverticulitis with  perforation.  ____________________________________________   INITIAL IMPRESSION / ASSESSMENT AND PLAN / ED COURSE  Pertinent labs & imaging results that were available during my care of the patient were reviewed by me and considered in my medical decision making (see chart for details).   Patient presents to the emergency department for abdominal pain more so in the lower abdomen.  I reviewed the patient's lab work from his urgent care and emergency department visit which is largely nonrevealing.  Normal urinalysis, largely normal lab work.  Patient is tender both in the right lower as well as left lower quadrants.  Differential would include appendicitis, colitis or diverticulitis.  Does not wish for any pain medication.  We will proceed with CT imaging.  Patient does state IV dye allergy.  CT consistent with complicated diverticulitis.  We will start the patient on Zosyn and admit to the surgical service.  Patient agreeable to plan of care.  Spoke with Dr. Peyton Najjar who will be seeing the patient.   Caleb Manning was evaluated in Emergency Department on 12/30/2019 for the symptoms described in the history of present illness. He was evaluated in the context of the global COVID-19 pandemic, which necessitated consideration that the patient might be at risk for infection with the SARS-CoV-2 virus that causes COVID-19. Institutional protocols and algorithms that pertain to the evaluation of patients at risk for COVID-19 are in a state of rapid change based on information released by regulatory bodies including the CDC and federal and state organizations. These policies and algorithms were followed during the patient's care in the ED.  ____________________________________________   FINAL CLINICAL IMPRESSION(S) / ED DIAGNOSES  Abdominal pain Complicated diverticulitis   Harvest Dark, MD 12/30/19 1037

## 2019-12-30 NOTE — ED Notes (Signed)
Pt states that he has had abdominal pain and fever since Thursday. Pt is tender in the LUQ, LLQ, and RLQ. Pt denies N/V/D.

## 2019-12-30 NOTE — H&P (Signed)
SURGICAL HISTORY AND PHYSICAL NOTE   HISTORY OF PRESENT ILLNESS (HPI):  39 y.o. male presented to [facility] ED for evaluation of abdominal pain since 3 days ago. Patient reports the pain initially started in the mid abdomen and now is a little bit more generalized in the lower abdomen.  There is no other pain radiation.  There has been no alleviating factors.  Aggravating factor is applying pressure in the abdomen.  Patient also reports associated fever at home.  Denies nausea or vomiting or diarrhea.  Patient came to the ED due to persistent and worsening abdominal pain.  At the ED he had labs that shows leukocytosis of 15,000.  CT scan of the abdomen and pelvis was done.  This shows significant pericolonic inflammation of the sigmoid colon with 2 bubble of air.  Clinically.  There is no free air or free fluid.  I personally evaluated the images.  Surgery is consulted by Dr. Lenard Lance in this context for evaluation and management of acute diverticulitis.  PAST MEDICAL HISTORY (PMH):  History reviewed. No pertinent past medical history.   PAST SURGICAL HISTORY (PSH):  Past Surgical History:  Procedure Laterality Date  . NOSE SURGERY       MEDICATIONS:  Prior to Admission medications   Medication Sig Start Date End Date Taking? Authorizing Provider  Cyanocobalamin (VITAMIN B-12) 1000 MCG/15ML LIQD Take 30 mLs by mouth once.      [provider]  Multiple Vitamins-Minerals (MULTIVITAMINS THER. W/MINERALS) TABS Take 1 tablet by mouth daily.      [provider]     ALLERGIES:  Allergies  Allergen Reactions  . Ivp Dye [Iodinated Diagnostic Agents] Shortness Of Breath     SOCIAL HISTORY:  Social History   Socioeconomic History  . Marital status: Married    Spouse name: Not on file  . Number of children: Not on file  . Years of education: Not on file  . Highest education level: Not on file  Occupational History  . Not on file  Tobacco Use  . Smoking status: Never  Smoker  . Smokeless tobacco: Never Used  Substance and Sexual Activity  . Alcohol use: No  . Drug use: No  . Sexual activity: Not on file  Other Topics Concern  . Not on file  Social History Narrative  . Not on file   Social Determinants of Health   Financial Resource Strain:   . Difficulty of Paying Living Expenses:   Food Insecurity:   . Worried About Programme researcher, broadcasting/film/video in the Last Year:   . Barista in the Last Year:   Transportation Needs:   . Freight forwarder (Medical):   Marland Kitchen Lack of Transportation (Non-Medical):   Physical Activity:   . Days of Exercise per Week:   . Minutes of Exercise per Session:   Stress:   . Feeling of Stress :   Social Connections:   . Frequency of Communication with Friends and Family:   . Frequency of Social Gatherings with Friends and Family:   . Attends Religious Services:   . Active Member of Clubs or Organizations:   . Attends Banker Meetings:   Marland Kitchen Marital Status:   Intimate Partner Violence:   . Fear of Current or Ex-Partner:   . Emotionally Abused:   Marland Kitchen Physically Abused:   . Sexually Abused:       FAMILY HISTORY:  History reviewed. No pertinent family history.   REVIEW OF SYSTEMS:  Constitutional: denies weight loss, chills, or sweats.  Positive for fever Eyes: denies any other vision changes, history of eye injury  ENT: denies sore throat, hearing problems  Respiratory: denies shortness of breath, wheezing  Cardiovascular: denies chest pain, palpitations  Gastrointestinal: Positive abdominal pain, negative nausea and vomiting Genitourinary: denies burning with urination or urinary frequency Musculoskeletal: denies any other joint pains or cramps  Skin: denies any other rashes or skin discolorations  Neurological: denies any other headache, dizziness, weakness  Psychiatric: denies any other depression, anxiety   All other review of systems were negative   VITAL SIGNS:  Temp:  [98.8 F (37.1  C)-100.9 F (38.3 C)] 98.8 F (37.1 C) (06/26 0854) Pulse Rate:  [102-105] 105 (06/26 0854) Resp:  [16] 16 (06/26 0854) BP: (125-149)/(78-95) 149/95 (06/26 0854) SpO2:  [98 %-100 %] 98 % (06/26 0854) Weight:  [115.7 kg] 115.7 kg (06/26 0855)     Height: 6\' 2"  (188 cm) Weight: 115.7 kg BMI (Calculated): 32.73   INTAKE/OUTPUT:  This shift: No intake/output data recorded.  Last 2 shifts: @IOLAST2SHIFTS @   PHYSICAL EXAM:  Constitutional:  -- Normal body habitus  -- Awake, alert, and oriented x3  Eyes:  -- Pupils equally round and reactive to light  -- No scleral icterus  Ear, nose, and throat:  -- No jugular venous distension  Pulmonary:  -- No crackles  -- Equal breath sounds bilaterally -- Breathing non-labored at rest Cardiovascular:  -- S1, S2 present  -- No pericardial rubs Gastrointestinal:  -- Abdomen soft, tender in mid abdomen and right to the midline lower abdomen, non-distended, no guarding or rebound tenderness -- No abdominal masses appreciated, pulsatile or otherwise  Musculoskeletal and Integumentary:  -- Wounds: None appreciated -- Extremities: B/L UE and LE FROM, hands and feet warm, no edema  Neurologic:  -- Motor function: intact and symmetric -- Sensation: intact and symmetric   Labs:  CBC Latest Ref Rng & Units 12/30/2019 12/29/2019 12/28/2019  WBC 4.0 - 10.5 K/uL 15.9(H) 12.3(H) 12.5(H)  Hemoglobin 13.0 - 17.0 g/dL 12/31/2019 12/30/2019 78.6  Hematocrit 39 - 52 % 40.8 43.0 44.0  Platelets 150 - 400 K/uL 244 246 251   CMP Latest Ref Rng & Units 12/30/2019 12/29/2019 12/28/2019  Glucose 70 - 99 mg/dL 12/31/2019) 12/30/2019) 85  BUN 6 - 20 mg/dL 11 8 8   Creatinine 0.61 - 1.24 mg/dL 470(J 628(Z  Sodium 135 - 145 mmol/L 133(L) 136 137  Potassium 3.5 - 5.1 mmol/L 4.0 4.0 4.2  Chloride 98 - 111 mmol/L 100 101 100  CO2 22 - 32 mmol/L 22 24 22   Calcium 8.9 - 10.3 mg/dL 6.62) 9.3 9.5  Total Protein 6.5 - 8.1 g/dL 9.47) 7.5 7.9  Total Bilirubin 0.3 - 1.2 mg/dL 3.0(H) 1.8(H)  1.1  Alkaline Phos 38 - 126 U/L 70 60 72  AST 15 - 41 U/L 57(H) 37 23  ALT 0 - 44 U/L 84(H) 54(H) 36     Imaging studies:  EXAM: CT ABDOMEN AND PELVIS WITHOUT CONTRAST  TECHNIQUE: Multidetector CT imaging of the abdomen and pelvis was performed following the standard protocol without IV contrast.  COMPARISON:  CT abdomen pelvis-06/04/2011  FINDINGS: The lack of intravenous contrast limits the ability to evaluate solid abdominal organs.  Lower chest: Limited visualization of the lower thorax is negative for focal airspace opacity or pleural effusion.  Normal heart size.  No pericardial effusion.  Hepatobiliary: Nodularity hepatic contour as could be seen in the setting of early cirrhotic  change. Normal noncontrast appearance of the gallbladder given degree of distention. No radiopaque gallstones. No ascites.  Pancreas: Normal noncontrast appearance of the pancreas.  Spleen: Normal noncontrast appearance of the spleen.  Adrenals/Urinary Tract: Normal noncontrast appearance of the bilateral kidneys. No renal stones. No renal stones are seen along the expected course of either ureter or the urinary bladder. Normal noncontrast appearance of the urinary bladder given degree distention. No urine obstruction or perinephric stranding.  Normal noncontrast appearance the bilateral adrenal glands.  Stomach/Bowel: Ill defined, though rather extensive mesenteric stranding surrounds the distal descending/proximal sigmoid colon within the midline of the lower abdomen/upper pelvis with two ill-defined foci of extraluminal air (representative image 58, series 2, coronal image 45, series 5), without definable/drainable fluid collection on this noncontrast examination, worrisome for an area of acute diverticulitis. No evidence of enteric obstruction. Bowel is otherwise normal in course and caliber without discrete area of bowel wall thickening. Normal noncontrast appearance  of the terminal ileum and appendix. No hiatal hernia. No pneumatosis or portal venous gas.  Vascular/Lymphatic: Normal caliber of the abdominal aorta.  No bulky retroperitoneal, mesenteric, pelvic or inguinal lymphadenopathy on this noncontrast examination.  Reproductive: Normal appearance the prostate gland. No free fluid the pelvic cul-de-sac. A punctate phlebolith is seen within the right hemipelvis.  Other: Regional soft tissues appear normal.  Musculoskeletal: No acute or aggressive osseous abnormalities. 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  IMPRESSION: 1. The examination is positive for acute diverticulitis involving the distal descending/proximal sigmoid colon within the midline of the lower abdomen/upper pelvis. There are two foci of extraluminal air with adjacent mesenteric stranding but without definable/drainable fluid collection on this noncontrast examination. No evidence of enteric obstruction. 2. Incidentally noted bilateral L5 pars defects without associated anterolisthesis.   Electronically Signed   By: Sandi Mariscal M.D.   On: 12/30/2019 09:41   Assessment/Plan:  39 y.o. male with acute diverticulitis.  Patient with first episode of acute diverticulitis.  Due to patient leukocytosis and persistent and worsening pain with fever at home I think that it is indicated to admit the patient to the hospital for IV antibiotic therapy and bowel rest.  I will assess the patient initially with physical exam, progression of the pain and leukocytosis trend.  If patient improves clinically we will continue with antibiotic therapy.  If patient deteriorates with recurrent fever or worsening abdominal pain will need to reassess with CT scan for formation of abscess or development of uncontained perforation.  At this moment patient with stable vital signs, no fever.  We will continue with Zosyn as IV antibiotic therapy.  Patient will be n.p.o. we  will keep hydrated with IV fluids.  I encouraged the patient to ambulate.  We will initiate DVT prophylaxis.  The patient was oriented about the diagnosis of acute diverticulitis and the plan of admission.  No emergent surgical management is indicated at this moment.  Patient agrees with plan.  Arnold Long, MD

## 2019-12-30 NOTE — ED Triage Notes (Signed)
Patient c/o abd pain X3 days with nausea, chills, and fever. Recnetly seen at [facility] but had to leave

## 2019-12-31 LAB — CBC
HCT: 37.3 % — ABNORMAL LOW (ref 39.0–52.0)
Hemoglobin: 13 g/dL (ref 13.0–17.0)
MCH: 33.1 pg (ref 26.0–34.0)
MCHC: 34.9 g/dL (ref 30.0–36.0)
MCV: 94.9 fL (ref 80.0–100.0)
Platelets: 211 10*3/uL (ref 150–400)
RBC: 3.93 MIL/uL — ABNORMAL LOW (ref 4.22–5.81)
RDW: 11.7 % (ref 11.5–15.5)
WBC: 9.9 10*3/uL (ref 4.0–10.5)
nRBC: 0 % (ref 0.0–0.2)

## 2019-12-31 LAB — BASIC METABOLIC PANEL
Anion gap: 10 (ref 5–15)
BUN: 11 mg/dL (ref 6–20)
CO2: 24 mmol/L (ref 22–32)
Calcium: 8.5 mg/dL — ABNORMAL LOW (ref 8.9–10.3)
Chloride: 104 mmol/L (ref 98–111)
Creatinine, Ser: 1.1 mg/dL (ref 0.61–1.24)
GFR calc Af Amer: >60 mL/min (ref >60)
GFR calc non Af Amer: >60 mL/min (ref >60)
Glucose, Bld: 90 mg/dL (ref 70–99)
Potassium: 3.8 mmol/L (ref 3.5–5.1)
Sodium: 138 mmol/L (ref 135–145)

## 2019-12-31 NOTE — Progress Notes (Signed)
SURGICAL PROGRESS NOTE   Hospital Day(s): 1.   Post op day(s):  Marland Kitchen   Interval History: Patient seen and examined, no acute events or new complaints overnight. Patient reports feeling great.  He reported that the pain has resolved.  He denies any nausea or vomiting.  Highest temp was 100.0.  No pain radiation.  No alleviating or aggravating factor.  Vital signs in last 24 hours: [min-max] current  Temp:  [98.7 F (37.1 C)-100 F (37.8 C)] 98.7 F (37.1 C) (06/27 0415) Pulse Rate:  [81-95] 82 (06/27 0415) Resp:  [14-20] 20 (06/27 0415) BP: (119-137)/(64-82) 123/64 (06/27 0415) SpO2:  [96 %-100 %] 100 % (06/27 0415)     Height: 6\' 2"  (188 cm) Weight: 115.7 kg BMI (Calculated): 32.73   Physical Exam:  Constitutional: alert, cooperative and no distress  Respiratory: breathing non-labored at rest  Cardiovascular: regular rate and sinus rhythm  Gastrointestinal: soft, non-tender, and non-distended  Labs:  CBC Latest Ref Rng & Units 12/31/2019 12/30/2019 12/29/2019  WBC 4.0 - 10.5 K/uL 9.9 15.9(H) 12.3(H)  Hemoglobin 13.0 - 17.0 g/dL 12/31/2019 88.9 16.9  Hematocrit 39 - 52 % 37.3(L) 40.8 43.0  Platelets 150 - 400 K/uL 211 244 246   CMP Latest Ref Rng & Units 12/31/2019 12/30/2019 12/29/2019  Glucose 70 - 99 mg/dL 90 12/31/2019) 388(E)  BUN 6 - 20 mg/dL 11 11 8   Creatinine 0.61 - 1.24 mg/dL 280(K 3.49  Sodium 135 - 145 mmol/L 138 133(L) 136  Potassium 3.5 - 5.1 mmol/L 3.8 4.0 4.0  Chloride 98 - 111 mmol/L 104 100 101  CO2 22 - 32 mmol/L 24 22 24   Calcium 8.9 - 10.3 mg/dL 1.79) 1.50) 9.3  Total Protein 6.5 - 8.1 g/dL - 8.4(H) 7.5  Total Bilirubin 0.3 - 1.2 mg/dL - 3.0(H) 1.8(H)  Alkaline Phos 38 - 126 U/L - 70 60  AST 15 - 41 U/L - 57(H) 37  ALT 0 - 44 U/L - 84(H) 54(H)    Imaging studies: No new pertinent imaging studies   Assessment/Plan:  39 y.o. male with acute diverticulitis.  Patient with significant improvement overnight with IV antibiotic therapy and bowel rest.  Will  start clear liquid diet.  Will assess for diet toleration.  If he tolerates will consider to advance diet to full liquids for supper.  We will continue to follow temp so that it does not get higher.  We will continue with IV antibiotic therapy.  We will continue with DVT prophylaxis.  Encouraged the patient to ambulate.  5.6(P, MD

## 2019-12-31 NOTE — Plan of Care (Signed)
Diet advance to clear liquid diet. Tolerating CLD. No pain, no n/v.No other complaints.

## 2020-01-01 MED ORDER — HYDROCODONE-ACETAMINOPHEN 5-325 MG PO TABS: 1.0000 | ORAL_TABLET | ORAL | 0 refills | Status: AC | PRN

## 2020-01-01 MED ORDER — AMOXICILLIN-POT CLAVULANATE 875-125 MG PO TABS: 1.0000 | ORAL_TABLET | Freq: Two times a day (BID) | ORAL | 0 refills | Status: AC

## 2020-01-01 NOTE — Discharge Instructions (Signed)
  Diet: Resume soft low fiber diet until follow up.    Activity: Increase activity as tolerated. Light activity and walking are encouraged. Do not drive or drink alcohol if taking narcotic pain medications.  Medications: Resume all home medications. For mild to moderate pain: acetaminophen (Tylenol) or ibuprofen (if no kidney disease). Combining Tylenol with alcohol can substantially increase your risk of causing liver disease. Narcotic pain medications, if prescribed, can be used for severe pain, though may cause nausea, constipation, and drowsiness. Do not combine Tylenol and Norco within a 6 hour period as Norco contains Tylenol. If you do not need the narcotic pain medication, you do not need to fill the prescription.  Take antibiotic therapy as prescribed.   Call office [phone]) at any time if any questions, worsening pain, fevers/chills, bleeding, drainage from incision site, or other concerns.

## 2020-01-01 NOTE — Discharge Summary (Signed)
  Patient ID: Caleb Manning MRN: [id number] DOB/AGE: [birth year] 41 y.o.  Admit date: 12/30/2019 Discharge date: 01/01/2020   Discharge Diagnoses:  Active Problems:   Acute diverticulitis   Procedures: None  Hospital Course: Patient admitted with acute diverticulitis. Started on IV antibiotic and the pain resolved completely immediately. Started on clear liquid diet and advanced to full liquids. No pain with oral intake. No fever. Ambulating.   Physical Exam Cardiovascular:     Rate and Rhythm: Normal rate and regular rhythm.     Heart sounds: Normal heart sounds.  Pulmonary:     Effort: Pulmonary effort is normal.  Abdominal:     General: Abdomen is flat. Bowel sounds are normal. There is no distension.     Palpations: Abdomen is soft.     Tenderness: There is no abdominal tenderness.  Skin:    General: Skin is warm.  Neurological:     General: No focal deficit present.     Mental Status: He is alert.    Consults: None  Disposition: Discharge disposition: 01-Home or Self Care       Discharge Instructions    Increase activity slowly   Complete by: As directed      Allergies as of 01/01/2020      Reactions   Ivp Dye [iodinated Diagnostic Agents] Shortness Of Breath      Medication List    TAKE these medications   amoxicillin-clavulanate 875-125 MG tablet Commonly known as: Augmentin Take 1 tablet by mouth 2 (two) times daily for 12 days.   HYDROcodone-acetaminophen 5-325 MG tablet Commonly known as: Norco Take 1 tablet by mouth every 4 (four) hours as needed for up to 3 days for moderate pain.   multivitamins ther. w/minerals Tabs tablet Take 1 tablet by mouth daily.   Vitamin B-12 1000 MCG/15ML Liqd Take 30 mLs by mouth once.       Follow-up Information    Carolan Shiver, MD Follow up in 2 week(s).   Specialty: General Surgery Contact information: [address] [place] [address] [phone]              This  discharge encounter was more than 30 minutes most of the time coordinating plan of care.

## 2020-01-01 NOTE — Progress Notes (Signed)
Caleb Manning  A and O x 4. VSS. Pt tolerating diet well. No complaints of pain or nausea. IV removed intact, prescriptions given. Pt voiced understanding of discharge instructions with no further questions. Pt discharged Home.    Allergies as of 01/01/2020      Reactions   Ivp Dye [iodinated Diagnostic Agents] Shortness Of Breath      Medication List    TAKE these medications   amoxicillin-clavulanate 875-125 MG tablet Commonly known as: Augmentin Take 1 tablet by mouth 2 (two) times daily for 12 days.   HYDROcodone-acetaminophen 5-325 MG tablet Commonly known as: Norco Take 1 tablet by mouth every 4 (four) hours as needed for up to 3 days for moderate pain.   multivitamins ther. w/minerals Tabs tablet Take 1 tablet by mouth daily.   Vitamin B-12 1000 MCG/15ML Liqd Take 30 mLs by mouth once.       Vitals:   12/31/19 1934 01/01/20 0655  BP: 134/84 (!) 152/68  Pulse: 76 63  Resp: 18 18  Temp: 99.1 F (37.3 C) 98.2 F (36.8 C)  SpO2: 100% 100%    Suzzanne Cloud

## 2020-08-08 ENCOUNTER — Other Ambulatory Visit: Payer: Self-pay | Admitting: Family

## 2020-08-08 DIAGNOSIS — R109 Unspecified abdominal pain: Secondary | ICD-10-CM

## 2020-08-16 ENCOUNTER — Ambulatory Visit
Admission: RE | Admit: 2020-08-16 | Discharge: 2020-08-16 | Disposition: A | Discharge status: Home / Self Care | Payer: No Typology Code available for payment source | Source: Ambulatory Visit | Attending: Family | Admitting: Family

## 2020-08-16 DIAGNOSIS — R109 Unspecified abdominal pain: Secondary | ICD-10-CM

## 2022-11-09 ENCOUNTER — Other Ambulatory Visit: Payer: Self-pay

## 2022-11-09 ENCOUNTER — Encounter: Payer: Self-pay | Admitting: Emergency Medicine

## 2022-11-09 ENCOUNTER — Emergency Department: Payer: Commercial Managed Care - PPO

## 2022-11-09 ENCOUNTER — Emergency Department
Admission: EM | Admit: 2022-11-09 | Discharge: 2022-11-09 | Disposition: A | Discharge status: Home / Self Care | Payer: Commercial Managed Care - PPO | Attending: Emergency Medicine | Admitting: Emergency Medicine

## 2022-11-09 DIAGNOSIS — R0789 Other chest pain: Secondary | ICD-10-CM | POA: Diagnosis not present

## 2022-11-09 DIAGNOSIS — R197 Diarrhea, unspecified: Secondary | ICD-10-CM | POA: Insufficient documentation

## 2022-11-09 DIAGNOSIS — R1013 Epigastric pain: Secondary | ICD-10-CM | POA: Insufficient documentation

## 2022-11-09 DIAGNOSIS — R079 Chest pain, unspecified: Secondary | ICD-10-CM

## 2022-11-09 DIAGNOSIS — R110 Nausea: Secondary | ICD-10-CM | POA: Diagnosis not present

## 2022-11-09 HISTORY — DX: Diverticulitis of intestine, part unspecified, without perforation or abscess without bleeding: K57.92

## 2022-11-09 LAB — CBC
HCT: 47.1 % (ref 39.0–52.0)
Hemoglobin: 16.1 g/dL (ref 13.0–17.0)
MCH: 32.1 pg (ref 26.0–34.0)
MCHC: 34.2 g/dL (ref 30.0–36.0)
MCV: 93.8 fL (ref 80.0–100.0)
Platelets: 283 10*3/uL (ref 150–400)
RBC: 5.02 MIL/uL (ref 4.22–5.81)
RDW: 12.1 % (ref 11.5–15.5)
WBC: 4.1 10*3/uL (ref 4.0–10.5)
nRBC: 0 % (ref 0.0–0.2)

## 2022-11-09 LAB — BASIC METABOLIC PANEL
Anion gap: 10 (ref 5–15)
BUN: 12 mg/dL (ref 6–20)
CO2: 23 mmol/L (ref 22–32)
Calcium: 9.2 mg/dL (ref 8.9–10.3)
Chloride: 104 mmol/L (ref 98–111)
Creatinine, Ser: 0.97 mg/dL (ref 0.61–1.24)
GFR, Estimated: >60 mL/min (ref >60)
Glucose, Bld: 180 mg/dL — ABNORMAL HIGH (ref 70–99)
Potassium: 4 mmol/L (ref 3.5–5.1)
Sodium: 137 mmol/L (ref 135–145)

## 2022-11-09 LAB — TROPONIN I (HIGH SENSITIVITY)
Troponin I (High Sensitivity): 6 ng/L (ref <18)
Troponin I (High Sensitivity): 4 ng/L (ref <18)

## 2022-11-09 LAB — LIPASE, BLOOD: Lipase: 29 U/L (ref 11–51)

## 2022-11-09 LAB — HEPATIC FUNCTION PANEL
ALT: 43 U/L (ref 0–44)
AST: 32 U/L (ref 15–41)
Albumin: 4.5 g/dL (ref 3.5–5.0)
Alkaline Phosphatase: 78 U/L (ref 38–126)
Bilirubin, Direct: <0.1 mg/dL (ref 0.0–0.2)
Total Bilirubin: 0.8 mg/dL (ref 0.3–1.2)
Total Protein: 8 g/dL (ref 6.5–8.1)

## 2022-11-09 MED ORDER — ALUM & MAG HYDROXIDE-SIMETH 200-200-20 MG/5ML PO SUSP
30.0000 mL | Freq: Once | ORAL | Status: AC
  Administered 2022-11-09: 30 mL via ORAL
  Filled 2022-11-09: qty 30

## 2022-11-09 MED ORDER — LIDOCAINE VISCOUS HCL 2 % MT SOLN
15.0000 mL | Freq: Once | OROMUCOSAL | Status: AC
  Administered 2022-11-09: 15 mL via ORAL
  Filled 2022-11-09: qty 15

## 2022-11-09 NOTE — ED Provider Notes (Signed)
[facility] Provider Note    Event Date/Time   First MD Initiated Contact with Patient 11/09/22 [phone]     (approximate)  History   Chief Complaint: Chest Pain  HPI  Caleb Manning is a 42 y.o. male with a past medical history of diverticulitis who presents to the emergency department for upper abdominal pain/chest pain and nausea.  According to the patient he woke up around 5:00 this morning with upper abdomen/lower chest pain described as a dull type pain and constant 6/10 in severity.  States this morning he also broke out into a sweat and felt nauseated.  Patient states since yesterday he has been experiencing episodes of diarrhea.  States yesterday he ate chicken that smelled like it could have gone bad.  States this morning he initially thought it was more indigestion type symptoms but as symptoms have not resolved he came to the emergency department for evaluation.  Denies any cardiac history.  This of breath or respiratory symptoms.  Physical Exam   Triage Vital Signs: ED Triage Vitals  Enc Vitals Group     BP 11/09/22 0837 (!) 151/88     Pulse Rate 11/09/22 0837 82     Resp 11/09/22 0837 19     Temp 11/09/22 0837 98 F (36.7 C)     Temp src --      SpO2 11/09/22 0837 100 %     Weight 11/09/22 0831 255 lb 1.2 oz (115.7 kg)     Height 11/09/22 0831 6\' 2"  (1.88 m)     Head Circumference --      Peak Flow --      Pain Score 11/09/22 0831 6     Pain Loc --      Pain Edu? --      Excl. in GC? --     Most recent vital signs: Vitals:   11/09/22 0837  BP: (!) 151/88  Pulse: 82  Resp: 19  Temp: 98 F (36.7 C)  SpO2: 100%    General: Awake, no distress.  CV:  Good peripheral perfusion.  Regular rate and rhythm  Resp:  Normal effort.  Equal breath sounds bilaterally.  Abd:  No distention.  Soft, moderate epigastric tenderness to palpation no rebound or guarding.  No right upper quadrant tenderness.  ED Results / Procedures / Treatments    EKG  EKG viewed and interpreted by myself shows normal sinus rhythm at 83 bpm with a narrow QRS, normal axis, normal intervals, no concerning ST changes.  RADIOLOGY  I have reviewed and interpreted chest x-ray images.  No consolidation seen on my evaluation.   MEDICATIONS ORDERED IN ED: Medications  alum & mag hydroxide-simeth (MAALOX/MYLANTA) 200-200-20 MG/5ML suspension 30 mL (has no administration in time range)    And  lidocaine (XYLOCAINE) 2 % viscous mouth solution 15 mL (has no administration in time range)     IMPRESSION / MDM / ASSESSMENT AND PLAN / ED COURSE  I reviewed the triage vital signs and the nursing notes.  Patient's presentation is most consistent with acute presentation with potential threat to life or bodily function.  Patient presents emergency department for epigastric/chest pain starting this morning around 5 AM.  Overall the patient appears well, no distress.  Does have moderate epigastric tenderness on my evaluation.  Reassuring EKG, reassuring chest x-ray.  We will check labs including cardiac enzymes lipase and LFTs.  We will dose a GI cocktail and continue to closely monitor.  Differential  would include ACS, pancreatitis, gallbladder pathology, gastritis or indigestion.  Patient is feeling much better after GI cocktail.  Denies any discomfort currently.  Patient's workup is reassuring including a normal chemistry, normal CBC, negative troponin x 2 and a normal lipase and hepatic function panel.  Chest x-ray is clear and EKG reassuring.  Given the patient's reassuring workup and resolution of symptoms after GI cocktail discussed with patient possibility of more indigestion or gastritis type symptoms however did discuss my typical chest pain return precautions.  Patient agreeable to plan of care.  FINAL CLINICAL IMPRESSION(S) / ED DIAGNOSES   Epigastric pain    Note:  This document was prepared using Dragon voice recognition software and may include  unintentional dictation errors.   Minna Antis, MD 11/09/22 1115

## 2022-11-09 NOTE — ED Triage Notes (Signed)
C/O mid chest pain. Describes pain as dull. States woke patient up this morning at 0500.  Also states he breaks into a sweat intermittently.  Denies aggravating or alleviating factors.

## 2024-06-06 ENCOUNTER — Ambulatory Visit: Admitting: Internal Medicine

## 2024-08-02 ENCOUNTER — Encounter: Payer: Self-pay | Admitting: Emergency Medicine

## 2024-08-02 ENCOUNTER — Emergency Department

## 2024-08-02 ENCOUNTER — Other Ambulatory Visit: Payer: Self-pay

## 2024-08-02 ENCOUNTER — Inpatient Hospital Stay
Admission: EM | Admit: 2024-08-02 | Discharge: 2024-08-04 | DRG: 392 | Disposition: A | Discharge status: Home / Self Care | Attending: Internal Medicine | Admitting: Internal Medicine

## 2024-08-02 DIAGNOSIS — R739 Hyperglycemia, unspecified: Secondary | ICD-10-CM | POA: Diagnosis present

## 2024-08-02 DIAGNOSIS — R1030 Lower abdominal pain, unspecified: Secondary | ICD-10-CM | POA: Diagnosis present

## 2024-08-02 DIAGNOSIS — Z79899 Other long term (current) drug therapy: Secondary | ICD-10-CM | POA: Diagnosis not present

## 2024-08-02 DIAGNOSIS — K5720 Diverticulitis of large intestine with perforation and abscess without bleeding: Secondary | ICD-10-CM | POA: Diagnosis present

## 2024-08-02 LAB — COMPREHENSIVE METABOLIC PANEL WITH GFR
ALT: 38 U/L (ref 0–44)
AST: 22 U/L (ref 15–41)
Albumin: 4.6 g/dL (ref 3.5–5.0)
Alkaline Phosphatase: 70 U/L (ref 38–126)
Anion gap: 13 (ref 5–15)
BUN: 7 mg/dL (ref 6–20)
CO2: 23 mmol/L (ref 22–32)
Calcium: 9.1 mg/dL (ref 8.9–10.3)
Chloride: 101 mmol/L (ref 98–111)
Creatinine, Ser: 1.09 mg/dL (ref 0.61–1.24)
GFR, Estimated: >60 mL/min
Glucose, Bld: 137 mg/dL — ABNORMAL HIGH (ref 70–99)
Potassium: 3.8 mmol/L (ref 3.5–5.1)
Sodium: 138 mmol/L (ref 135–145)
Total Bilirubin: 1 mg/dL (ref 0.0–1.2)
Total Protein: 8.1 g/dL (ref 6.5–8.1)

## 2024-08-02 LAB — CBC
HCT: 40.9 % (ref 39.0–52.0)
Hemoglobin: 13.8 g/dL (ref 13.0–17.0)
MCH: 31.6 pg (ref 26.0–34.0)
MCHC: 33.7 g/dL (ref 30.0–36.0)
MCV: 93.6 fL (ref 80.0–100.0)
Platelets: 320 10*3/uL (ref 150–400)
RBC: 4.37 MIL/uL (ref 4.22–5.81)
RDW: 11.6 % (ref 11.5–15.5)
WBC: 11.1 10*3/uL — ABNORMAL HIGH (ref 4.0–10.5)
nRBC: 0 % (ref 0.0–0.2)

## 2024-08-02 LAB — URINALYSIS, ROUTINE W REFLEX MICROSCOPIC
Bacteria, UA: NONE SEEN
Bilirubin Urine: NEGATIVE
Glucose, UA: NEGATIVE mg/dL
Ketones, ur: NEGATIVE mg/dL
Leukocytes,Ua: NEGATIVE
Nitrite: NEGATIVE
Protein, ur: NEGATIVE mg/dL
Specific Gravity, Urine: 1.005 (ref 1.005–1.030)
pH: 6 (ref 5.0–8.0)

## 2024-08-02 LAB — SEDIMENTATION RATE: Sed Rate: 49 mm/h — ABNORMAL HIGH (ref 0–15)

## 2024-08-02 LAB — LIPASE, BLOOD: Lipase: 25 U/L (ref 11–51)

## 2024-08-02 LAB — C-REACTIVE PROTEIN: CRP: 10.3 mg/dL — ABNORMAL HIGH

## 2024-08-02 LAB — MAGNESIUM: Magnesium: 2 mg/dL (ref 1.7–2.4)

## 2024-08-02 MED ORDER — IOHEXOL 300 MG/ML  SOLN
100.0000 mL | Freq: Once | INTRAMUSCULAR | Status: AC | PRN
  Administered 2024-08-02: 100 mL via INTRAVENOUS

## 2024-08-02 MED ORDER — HEPARIN SODIUM (PORCINE) 5000 UNIT/ML IJ SOLN: 5000.0000 [IU] | Freq: Three times a day (TID) | INTRAMUSCULAR | Status: DC

## 2024-08-02 MED ORDER — PIPERACILLIN-TAZOBACTAM 3.375 G IVPB
3.3750 g | Freq: Three times a day (TID) | INTRAVENOUS | Status: DC
  Administered 2024-08-02 – 2024-08-04 (×5): 3.375 g via INTRAVENOUS
  Filled 2024-08-02 (×5): qty 50

## 2024-08-02 MED ORDER — ACETAMINOPHEN 325 MG PO TABS: 650.0000 mg | ORAL_TABLET | Freq: Four times a day (QID) | ORAL | Status: DC | PRN

## 2024-08-02 MED ORDER — SENNOSIDES-DOCUSATE SODIUM 8.6-50 MG PO TABS: 1.0000 | ORAL_TABLET | Freq: Every evening | ORAL | Status: DC | PRN

## 2024-08-02 MED ORDER — SODIUM CHLORIDE 0.9% FLUSH
3.0000 mL | Freq: Two times a day (BID) | INTRAVENOUS | Status: DC
  Administered 2024-08-02 – 2024-08-04 (×3): 3 mL via INTRAVENOUS

## 2024-08-02 MED ORDER — ONDANSETRON HCL 4 MG PO TABS: 4.0000 mg | ORAL_TABLET | Freq: Four times a day (QID) | ORAL | Status: DC | PRN

## 2024-08-02 MED ORDER — ONDANSETRON HCL 4 MG/2ML IJ SOLN: 4.0000 mg | Freq: Four times a day (QID) | INTRAMUSCULAR | Status: DC | PRN

## 2024-08-02 MED ORDER — ACETAMINOPHEN 650 MG RE SUPP: 650.0000 mg | Freq: Four times a day (QID) | RECTAL | Status: DC | PRN

## 2024-08-02 MED ORDER — OXYCODONE HCL 5 MG PO TABS: 5.0000 mg | ORAL_TABLET | ORAL | Status: DC | PRN

## 2024-08-02 MED ORDER — PIPERACILLIN-TAZOBACTAM 3.375 G IVPB 30 MIN
3.3750 g | Freq: Once | INTRAVENOUS | Status: AC
  Administered 2024-08-02: 3.375 g via INTRAVENOUS
  Filled 2024-08-02: qty 50

## 2024-08-02 NOTE — H&P (Addendum)
 " History and Physical    Caleb Manning FMW:[id number] DOB: [birth year] DOA: 08/02/2024  DOS: the patient was seen and examined on 08/02/2024  PCP: Patient, No Pcp Per   Patient coming from: Home  I have personally briefly reviewed patient's old medical records in [facility] and CareEverywhere  HPI:   Caleb Manning is a 44 y.o. year old male with history of diverticulitis presenting to the ED with worsening abdominal pain.  Patient states he has been having pain for 1 week.  He decreased his oral intake which improved his pain but pain recurred on Sunday and has been worsening.  He had an episode of diverticulitis in 2021 requiring hospitalization.  He states he has had 1 episode every year since then but has treated it at home.  He states he had a fever of 100.6.  Denies any sick contacts.  States he was instructed to follow-up with gastroenterology after last episode but has not done so.  He does not have a PCP.  Lives with wife and kids and does not smoke or drink alcohol.  Given patient's pain was not improving he presented to the ED for further evaluation. On arrival to the ED patient was noted to be HDS stable.  Lab work and imaging obtained.  CBC with mild leukocytosis otherwise unremarkable.  CMP with mild hyperglycemia otherwise unremarkable.  Lipase normal.  UA without sign of UTI.  CT abdomen pelvis show severe inflammatory changes of the distal sigmoid colon with likely microperforation but no abscess.  There is some concern for masslike appearance in the colon and underlying neoplasm is not excluded so follow-up colonoscopy is recommended.  General surgery consulted by EDP and they recommend medicine admission.  Given need for further care, [facility] contacted for admission.  Review of Systems: As mentioned in the history of present illness. All other systems reviewed and are negative.   Past Medical History:  Diagnosis Date   Diverticulitis     Past Surgical History:   Procedure Laterality Date   NOSE SURGERY       Allergies[1]  History reviewed. No pertinent family history.  Prior to Admission medications  Medication Sig Start Date End Date Taking? Authorizing Provider  ibuprofen (ADVIL) 800 MG tablet Take 800 mg by mouth every 8 (eight) hours as needed.   Yes [provider]  Multiple Vitamins-Minerals (MULTIVITAMINS THER. W/MINERALS) TABS Take 1 tablet by mouth daily.     Yes [provider]    Social History:  reports that he has never smoked. He has never used smokeless tobacco. He reports that he does not drink alcohol and does not use drugs.    Physical Exam: Vitals:   08/02/24 1029 08/02/24 1030 08/02/24 1435 08/02/24 1531  BP:  135/84 (!) 158/93 (!) 158/83  Pulse:  80 78 85  Resp:  18 18 16   Temp:  98.2 F (36.8 C) 98.2 F (36.8 C) 98.4 F (36.9 C)  TempSrc:  Oral Oral   SpO2: 100% 100% 99% 99%  Weight:      Height:        Gen: NAD HENT: NCAT CV: normal heart sounds Lung: CTAB Abd: TTP of LLQ, normal bowel sounds MSK: No asymmetry, good bulk and tone Neuro: alert and oriented   Labs on Admission: I have personally reviewed following labs and imaging studies  CBC: Recent Labs  Lab 08/02/24 0927  WBC 11.1*  HGB 13.8  HCT 40.9  MCV 93.6  PLT 320  Basic Metabolic Panel: Recent Labs  Lab 08/02/24 0927  NA 138  K 3.8  CL 101  CO2 23  GLUCOSE 137*  BUN 7  CREATININE 1.09  CALCIUM 9.1   GFR: Estimated Creatinine Clearance: 119 mL/min (by C-G formula based on SCr of 1.09 mg/dL). Liver Function Tests: Recent Labs  Lab 08/02/24 0927  AST 22  ALT 38  ALKPHOS 70  BILITOT 1.0  PROT 8.1  ALBUMIN 4.6   Recent Labs  Lab 08/02/24 0927  LIPASE 25   No results for input(s): AMMONIA in the last 168 hours. Coagulation Profile: No results for input(s): INR, PROTIME in the last 168 hours. Cardiac Enzymes: No results for input(s): CKTOTAL, CKMB, CKMBINDEX, TROPONINI,  TROPONINIHS in the last 168 hours. BNP (last 3 results) No results for input(s): BNP in the last 8760 hours. HbA1C: No results for input(s): HGBA1C in the last 72 hours. CBG: No results for input(s): GLUCAP in the last 168 hours. Lipid Profile: No results for input(s): CHOL, HDL, LDLCALC, TRIG, CHOLHDL, LDLDIRECT in the last 72 hours. Thyroid Function Tests: No results for input(s): TSH, T4TOTAL, FREET4, T3FREE, THYROIDAB in the last 72 hours. Anemia Panel: No results for input(s): VITAMINB12, FOLATE, FERRITIN, TIBC, IRON, RETICCTPCT in the last 72 hours. Urine analysis:    Component Value Date/Time   COLORURINE YELLOW (A) 08/02/2024 0927   APPEARANCEUR CLEAR (A) 08/02/2024 0927   APPEARANCEUR Clear 12/02/2011 1948   LABSPEC 1.005 08/02/2024 0927   LABSPEC 1.018 12/02/2011 1948   PHURINE 6.0 08/02/2024 0927   GLUCOSEU NEGATIVE 08/02/2024 0927   GLUCOSEU Negative 12/02/2011 1948   HGBUR SMALL (A) 08/02/2024 0927   BILIRUBINUR NEGATIVE 08/02/2024 0927   BILIRUBINUR negative 12/28/2019 1716   BILIRUBINUR Negative 12/02/2011 1948   KETONESUR NEGATIVE 08/02/2024 0927   PROTEINUR NEGATIVE 08/02/2024 0927   UROBILINOGEN 0.2 12/28/2019 1716   NITRITE NEGATIVE 08/02/2024 0927   LEUKOCYTESUR NEGATIVE 08/02/2024 0927   LEUKOCYTESUR Negative 12/02/2011 1948    Radiological Exams on Admission: I have personally reviewed images CT ABDOMEN PELVIS W CONTRAST Result Date: 08/02/2024 CLINICAL DATA:  Mid to lower abdominal pain. Clinical history of diverticulitis. EXAM: CT ABDOMEN AND PELVIS WITH CONTRAST TECHNIQUE: Multidetector CT imaging of the abdomen and pelvis was performed using the standard protocol following bolus administration of intravenous contrast. RADIATION DOSE REDUCTION: This exam was performed according to the departmental dose-optimization program which includes automated exposure control, adjustment of the mA and/or kV according to  patient size and/or use of iterative reconstruction technique. CONTRAST:  OMNIPAQUE  IOHEXOL  300 MG/ML  SOLN COMPARISON:  Prior CT abdomen/pelvis 08/16/2020 FINDINGS: Lower chest: No acute abnormality. Hepatobiliary: Normal hepatic contour and morphology. No discrete hepatic lesion. Diffuse low attenuation of the hepatic parenchyma with sparing adjacent to the gallbladder fossa consistent with hepatic steatosis. Gallbladder is unremarkable. No intra or extrahepatic biliary ductal dilatation. Pancreas: Unremarkable. No pancreatic ductal dilatation or surrounding inflammatory changes. Spleen: Normal in size without focal abnormality. Adrenals/Urinary Tract: Adrenal glands are unremarkable. Kidneys are normal, without renal calculi, focal lesion, or hydronephrosis. Bladder is unremarkable. Stomach/Bowel: Severe inflammatory changes affecting the distal sigmoid colon at a site of prior diverticulitis. Profound inflammatory stranding is present within the pericolonic fat. There is a single prominent pericolonic lymph node measuring up to 0.6 cm (image 64 series 2). A few locules of gas are present within the wall or just extraluminal consistent with micro perforation. No evidence of abscess at this time. Vascular/Lymphatic: No significant vascular findings are present. No enlarged  abdominal or pelvic lymph nodes. Reproductive: Prostate is unremarkable. Other: No abdominal wall hernia or abnormality. No abdominopelvic ascites. Musculoskeletal: Chronic right L5 pars defect. Left L5-S1 facet arthropathy. No anterolisthesis. No evidence of fracture or osseous lesion. IMPRESSION: 1. Severe inflammatory changes of the distal sigmoid colon the site prior diverticulitis with probable micro perforation in significant pericolonic inflammatory changes/phlegmon but no focal abscess formation at this time. Of note, given recurrent diverticulitis and the masslike appearance of the edema/inflammation, and underlying neoplasm is  difficult to exclude by imaging alone. Recommend follow-up colonoscopy after resolution of the acute symptoms to exclude malignancy. 2. Hepatic steatosis. 3. 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 4. Associated left L5-S1 degenerative facet arthropathy. Electronically Signed   By: Wilkie Lent M.D.   On: 08/02/2024 12:17    EKG: My personal interpretation of EKG shows: Pending    Assessment/Plan Principal Problem:   Diverticulitis of colon with perforation   Pt with left lower and mid quadrant pain with history of diverticulitis found to have acute diverticulitis.  Imaging showing concern for microperforation. CBC shows leukocytosis. ESR and CRP ordered. Blood cultures ordered. Pt started  on Zosyn .  General surgery consulted given microperforation concern.  Appreciate their recommendations.  Will keep n.p.o. and advance diet as tolerated.  Will place [facility] consult for PCP.  He will need outpatient follow-up with gastroenterology given concern for neoplasm to have repeat colonoscopy 4-6 weeks later.     VTE prophylaxis:  SQ Heparin   Diet: Code Status:  Full Code Telemetry:  Admission status: Inpatient, Med-Surg Patient is from: Home Anticipated d/c is to: Home Anticipated d/c is in: 2-3 days   Family Communication: Updated at bedside  Consults called: General Surgery   Severity of Illness: The appropriate patient status for this patient is OBSERVATION. Observation status is judged to be reasonable and necessary in order to provide the required intensity of service to ensure the patient's safety. The patient's presenting symptoms, physical exam findings, and initial radiographic and laboratory data in the context of their medical condition is felt to place them at decreased risk for further clinical deterioration. Furthermore, it is anticipated that the patient will be medically stable for discharge from the hospital within 2 midnights of admission.     Morene Bathe, MD [facility]     [1] No Active Allergies  "

## 2024-08-02 NOTE — ED Provider Notes (Signed)
 "  [facility] Provider Note    Event Date/Time   First MD Initiated Contact with Patient 08/02/24 [phone]     (approximate)   History   Abdominal Pain   HPI  Caleb Manning is a 44 y.o. male with a past medical history of complicated diverticulitis who presents today for evaluation of lower abdominal pain that began 8 to 9 days ago.  Patient reports that the pain feels reminiscent of his previous diverticulitis, and he started Augmentin  which he had at home.  He reports that he was taking this twice per day, and also fasted until Friday.  He reports that he began to resume a normal diet and his pain promptly returned.  He also reports that he has had temperature of 100.6 F.  No shaking chills.  For the past 2 days he has had only mucus stools.  No blood in his stool.  No nausea or vomiting.  Patient Active Problem List   Diagnosis Date Noted   Diverticulitis of intestine with perforation 08/02/2024   Diverticulitis of colon with perforation 08/02/2024   Acute diverticulitis 12/30/2019          Physical Exam   Triage Vital Signs: ED Triage Vitals  Encounter Vitals Group     BP 08/02/24 0927 (!) 140/86     Girls Systolic BP Percentile --      Girls Diastolic BP Percentile --      Boys Systolic BP Percentile --      Boys Diastolic BP Percentile --      Pulse Rate 08/02/24 0926 94     Resp 08/02/24 0926 18     Temp 08/02/24 0927 98.3 F (36.8 C)     Temp Source 08/02/24 0927 Oral     SpO2 08/02/24 0926 100 %     Weight 08/02/24 0925 270 lb (122.5 kg)     Height 08/02/24 0925 6' 2 (1.88 m)     Head Circumference --      Peak Flow --      Pain Score 08/02/24 0924 5     Pain Loc --      Pain Education --      Exclude from Growth Chart --     Most recent vital signs: Vitals:   08/02/24 1030 08/02/24 1435  BP: 135/84 (!) 158/93  Pulse: 80 78  Resp: 18 18  Temp: 98.2 F (36.8 C) 98.2 F (36.8 C)  SpO2: 100% 99%    Physical  Exam Vitals and nursing note reviewed.  Constitutional:      General: Awake and alert. No acute distress.    Appearance: Normal appearance. The patient is normal weight.  HENT:     Head: Normocephalic and atraumatic.     Mouth: Mucous membranes are moist.  Eyes:     General: PERRL. Normal EOMs        Right eye: No discharge.        Left eye: No discharge.     Conjunctiva/sclera: Conjunctivae normal.  Cardiovascular:     Rate and Rhythm: Normal rate and regular rhythm.     Pulses: Normal pulses.  Pulmonary:     Effort: Pulmonary effort is normal. No respiratory distress.     Breath sounds: Normal breath sounds.  Abdominal:     Abdomen is soft. There is lower middle and left lower quadrant abdominal tenderness. No rebound or guarding. No distention. Musculoskeletal:        General: No swelling.  Normal range of motion.     Cervical back: Normal range of motion and neck supple.  Skin:    General: Skin is warm and dry.     Capillary Refill: Capillary refill takes less than 2 seconds.     Findings: No rash.  Neurological:     Mental Status: The patient is awake and alert.      ED Results / Procedures / Treatments   Labs (all labs ordered are listed, but only abnormal results are displayed) Labs Reviewed  COMPREHENSIVE METABOLIC PANEL WITH GFR - Abnormal; Notable for the following components:      Result Value   Glucose, Bld 137 (*)    All other components within normal limits  CBC - Abnormal; Notable for the following components:   WBC 11.1 (*)    All other components within normal limits  URINALYSIS, ROUTINE W REFLEX MICROSCOPIC - Abnormal; Notable for the following components:   Color, Urine YELLOW (*)    APPearance CLEAR (*)    Hgb urine dipstick SMALL (*)    All other components within normal limits  LIPASE, BLOOD  HIV ANTIBODY (ROUTINE TESTING W REFLEX)  MAGNESIUM  SEDIMENTATION RATE  C-REACTIVE PROTEIN     EKG     RADIOLOGY I independently reviewed and  interpreted imaging and agree with radiologists findings.     PROCEDURES:  Critical Care performed:   Procedures   MEDICATIONS ORDERED IN ED: Medications  sodium chloride  flush (NS) 0.9 % injection 3 mL (has no administration in time range)  acetaminophen  (TYLENOL ) tablet 650 mg (has no administration in time range)    Or  acetaminophen  (TYLENOL ) suppository 650 mg (has no administration in time range)  senna-docusate (Senokot-S) tablet 1 tablet (has no administration in time range)  heparin  injection 5,000 Units (has no administration in time range)  ondansetron  (ZOFRAN ) tablet 4 mg (has no administration in time range)    Or  ondansetron  (ZOFRAN ) injection 4 mg (has no administration in time range)  oxyCODONE  (Oxy IR/ROXICODONE ) immediate release tablet 5 mg (has no administration in time range)  iohexol  (OMNIPAQUE ) 300 MG/ML solution 100 mL (100 mLs Intravenous Contrast Given 08/02/24 1128)  piperacillin -tazobactam (ZOSYN ) IVPB 3.375 g (0 g Intravenous Stopped 08/02/24 1258)     IMPRESSION / MDM / ASSESSMENT AND PLAN / ED COURSE  I reviewed the triage vital signs and the nursing notes.   Differential diagnosis includes, but is not limited to, diverticulitis, phlegmon, microperforation.  I reviewed the patient's chart.  Patient was seen in 2021 with similar complaint and was found to have complicated diverticulitis.  He was admitted to the surgical service for IV antibiotics and bowel rest.  He was treated with Zosyn .  Patient presents to the emergency department awake and alert, hemodynamically stable and afebrile.  He has tenderness in his lower abdomen, though no rebound or guarding.  Labs obtained in triage are revealing for leukocytosis to 11.1.  I did recommend repeat imaging, and patient is in agreement.  There is a documented allergy in his chart to contrast dye, the patient is quite adamant that he did not have an allergy to contrast dye and is requesting for this  allergy to be removed.  He accepts any risk that comes with administering IV contrast including allergic reaction of any severity, and he was still like to proceed with this.  I discussed this with radiology who also agrees with proceeding with contrast CT.  CAT scan reveals severe inflammatory changes in the  distal sigmoid colon at the site of the prior diverticulitis, with probable microperforation and significant pericolonic inflammatory changes/phlegmon without focal abscess formation at this time.  The radiologist also notes that given the masslike appearance of the edema/inflammation, underlying neoplasm is difficult to exclude.  Recommendations for follow-up colonoscopy after resolution of acute symptoms to exclude malignancy was discussed.  I consulted general surgery regarding patient's findings.  Patient was evaluated by Dr. Marinda who has requested admission to the hospitalist service.  No surgery planned at this time.  Agrees with continuing Zosyn , and patient can have clear liquids.  Patient was accepted by hospitalist Dr. Fernand.  Patient's presentation is most consistent with acute presentation with potential threat to life or bodily function.   Clinical Course as of 08/02/24 1456  Wed Aug 02, 2024  1018 There is a documented IV contrast allergy in the patient's chart.  Patient has requested for this to be removed because he would like to proceed with CAT scan with contrast.  Patient reports that he got fidgety with the contrast dye in the past, but has never had any airway concerns, swelling, or rash.  He would like this to be removed from his chart.  He understands the risk of potential allergic reaction with receiving contrast dye and reports that he accepts this risk. [JP]  1223 Dr. Marinda paged [JP]  1230 Discussed with OR nurse as Dr. Marinda is in surgery, Dr. Marinda will see the patient after surgery [JP]  1432 Dr. Marinda evaluated patient.  Has requested medicine admit.  Okay  with clear fluids at this time. [JP]    Clinical Course User Index [JP] Olan Kurek E, PA-C     FINAL CLINICAL IMPRESSION(S) / ED DIAGNOSES   Final diagnoses:  Diverticulitis of colon with perforation     Rx / DC Orders   ED Discharge Orders     None        Note:  This document was prepared using Dragon voice recognition software and may include unintentional dictation errors.   Tiajuana Leppanen E, PA-C 08/02/24 1456    Fernand Rossie HERO, MD 08/02/24 1537  "

## 2024-08-02 NOTE — H&P (Signed)
 Patient ID: Caleb Manning, male   DOB: 1981-02-10, 44 y.o.   MRN: [id number] CC: Diverticulitis History of Present Illness Caleb Manning is a 44 y.o. male with past medical history significant for diverticulitis who presents in consultation for the same.  The patient reports that 5 years ago he had a bout of diverticulitis..  At that time it appears that he was admitted to the hospital and treated with IV antibiotics.  He says that he felt so great he did not follow-up to get a colonoscopy.  He reports that last week he developed similar superpubic pain.  He had a suspicion that this was secondary to diverticulitis so he self treated with amoxicillin  he had and then making himself NPO.  He felt better at the end of last week but then on Saturday had a full meal and started to develop suprapubic pain.  He describes the pain as intermittently sharp.  This is also been accompanied by fevers to 100.6.  He has been passing flatus and says he has had some mucus that he has passed but denies any blood in his stool.  Past Medical History Past Medical History:  Diagnosis Date   Diverticulitis        Past Surgical History:  Procedure Laterality Date   NOSE SURGERY      Allergies[1]  Current Facility-Administered Medications  Medication Dose Route Frequency Provider Last Rate Last Admin   sodium chloride  flush (NS) 0.9 % injection 3 mL  3 mL Intravenous Q12H Fernand Prost, MD       Current Outpatient Medications  Medication Sig Dispense Refill   Cyanocobalamin (VITAMIN B-12) 1000 MCG/15ML LIQD Take 30 mLs by mouth once.       gabapentin (NEURONTIN) 300 MG capsule Take 300 mg by mouth 3 (three) times daily.     ibuprofen (ADVIL) 800 MG tablet Take 800 mg by mouth every 8 (eight) hours as needed.     Multiple Vitamins-Minerals (MULTIVITAMINS THER. W/MINERALS) TABS Take 1 tablet by mouth daily.        Family History History reviewed. No pertinent family history.     Social History Social  History[2]  Denies tobacco use or alcohol use   ROS Full ROS of systems performed and is otherwise negative there than what is stated in the HPI  Physical Exam Blood pressure (!) 158/93, pulse 78, temperature 98.2 F (36.8 C), temperature source Oral, resp. rate 18, height 6' 2 (1.88 m), weight 122.5 kg, SpO2 99%.  Alert and oriented x 3, normal, breathing room air, regular rate and rhythm, abdomen soft, tender to palpation in the suprapubic region with mild tenderness in the left lower quadrant.  No rebound tenderness or guarding.  No signs of peritonitis.  Moving all extremities spontaneously mood and affect appropriate Data Reviewed CT scan examined.  He does have an area of inflammation in the sigmoid colon with some extraluminal air but no abscess formation.  I have personally reviewed the patient's imaging and medical records.    Assessment    Patient with microperforation of diverticulitis.  He has essentially failed outpatient treatment as he attempted this on his own last week.  Plan    Given there is no peritonitis no surgical intervention needed at this time.  Recommend admission to the hospitalist team.  Okay for him to have a clear liquid diet.  Would start on IV antibiotics.  We will monitor his pain and fever curve over the next 24 to 48 hours.  I suspect and hope that he will be able to recover and be discharged on antibiotics.  I recommended that once this episode is over in 6 to 8 weeks he will need a colonoscopy.  We will also talk at that time the role of surgical resection.  If he does not get better over the next 24 to 40 hours may need to rescan him to see if there is an abscess formation.  I total 60 minutes was spent reviewing patient chart, performing history and physical discussing treatment options with the patient  Caleb Manning 08/02/2024, 2:47 PM     [1]  Allergies Allergen Reactions   Iodinated Contrast Media Shortness Of Breath and Other (See  Comments)  [2]  Social History Tobacco Use   Smoking status: Never   Smokeless tobacco: Never  Substance Use Topics   Alcohol use: No   Drug use: No

## 2024-08-02 NOTE — ED Triage Notes (Signed)
 Pt via POV from home. Pt c/o mid-lower abd pain for the since Saturday. Reports nausea and diarrhea. Pt has a hx of diverticulitis. Pt is A&Ox4 and NAD, ambulatory to triage with steady gait.

## 2024-08-03 DIAGNOSIS — K5720 Diverticulitis of large intestine with perforation and abscess without bleeding: Secondary | ICD-10-CM | POA: Diagnosis not present

## 2024-08-03 LAB — BASIC METABOLIC PANEL WITH GFR
Anion gap: 15 (ref 5–15)
BUN: 7 mg/dL (ref 6–20)
CO2: 22 mmol/L (ref 22–32)
Calcium: 8.7 mg/dL — ABNORMAL LOW (ref 8.9–10.3)
Chloride: 100 mmol/L (ref 98–111)
Creatinine, Ser: 1.11 mg/dL (ref 0.61–1.24)
GFR, Estimated: >60 mL/min
Glucose, Bld: 103 mg/dL — ABNORMAL HIGH (ref 70–99)
Potassium: 3.5 mmol/L (ref 3.5–5.1)
Sodium: 137 mmol/L (ref 135–145)

## 2024-08-03 LAB — CBC
HCT: 35.7 % — ABNORMAL LOW (ref 39.0–52.0)
Hemoglobin: 12.4 g/dL — ABNORMAL LOW (ref 13.0–17.0)
MCH: 32.7 pg (ref 26.0–34.0)
MCHC: 34.7 g/dL (ref 30.0–36.0)
MCV: 94.2 fL (ref 80.0–100.0)
Platelets: 283 10*3/uL (ref 150–400)
RBC: 3.79 MIL/uL — ABNORMAL LOW (ref 4.22–5.81)
RDW: 11.9 % (ref 11.5–15.5)
WBC: 10.9 10*3/uL — ABNORMAL HIGH (ref 4.0–10.5)
nRBC: 0 % (ref 0.0–0.2)

## 2024-08-03 LAB — C-REACTIVE PROTEIN: CRP: 12.6 mg/dL — ABNORMAL HIGH

## 2024-08-03 LAB — SEDIMENTATION RATE: Sed Rate: 47 mm/h — ABNORMAL HIGH (ref 0–15)

## 2024-08-03 LAB — GLUCOSE, CAPILLARY: Glucose-Capillary: 127 mg/dL — ABNORMAL HIGH (ref 70–99)

## 2024-08-03 MED ORDER — ADULT MULTIVITAMIN W/MINERALS CH
1.0000 | ORAL_TABLET | Freq: Every day | ORAL | Status: DC
  Administered 2024-08-03 – 2024-08-04 (×2): 1 via ORAL
  Filled 2024-08-03 (×2): qty 1

## 2024-08-03 MED ORDER — ENOXAPARIN SODIUM 60 MG/0.6ML IJ SOSY
0.5000 mg/kg | PREFILLED_SYRINGE | INTRAMUSCULAR | Status: DC
  Filled 2024-08-03: qty 0.6

## 2024-08-03 NOTE — TOC CM/SW Note (Signed)
 Transition of Care [facility]) - Inpatient Brief Assessment   Patient Details  Name: Caleb Manning MRN: [id number] Date of Birth: [birth year]  Transition of Care [facility]) CM/SW Contact:    Corean ONEIDA Haddock, RN Phone Number: 08/03/2024, 3:59 PM   Clinical Narrative:  Transition of Care Department [facility]) has reviewed patient and no TOC needs have been identified at this time.  If new patient transition needs arise, please place a TOC consult.   Transition of Care Asessment: Insurance and Status: Insurance coverage has been reviewed Patient has primary care physician: No (list of local PCP added to AVS)     Prior/Current Home Services: No current home services Social Drivers of Health Review: SDOH reviewed no interventions necessary Readmission risk has been reviewed: Yes Transition of care needs: no transition of care needs at this time

## 2024-08-03 NOTE — Progress Notes (Signed)
" °  Progress Note   Patient: Caleb Manning FMW:[id number] DOB: 1980-12-01 DOA: 08/02/2024     1 DOS: the patient was seen and examined on 08/03/2024   Brief hospital course: Partly taken from H&P.  Caleb Manning is a 44 y.o. year old male with history of diverticulitis presenting to the ED with worsening abdominal pain for 1 week with decreased p.o. intake.  Patient has history of diverticulitis in 2021 requiring hospitalization.  He did get exacerbations almost yearly which were treated at home.  On presentation patient was febrile at 100.6, otherwise stable vitals.  CBC with mild leukocytosis and CMP with mild hyperglycemia, otherwise unremarkable.  UA negative for UTI. CT abdomen pelvis show severe inflammatory changes of the distal sigmoid colon with likely microperforation but no abscess. There is some concern for masslike appearance in the colon and underlying neoplasm is not excluded so follow-up colonoscopy is recommended.   General surgery was consulted and patient was started on Zosyn .  Patient will need outpatient GI evaluation and colonoscopy.  1/29: Vital stable, ESR elevated at 47, WBC 10.9 but all cell lines decreased so likely some dilutional effect, preliminary blood cultures negative. Passing flatus, surgery is advancing diet and if continues to tolerate and had a bowel movement then likely be discharged tomorrow.  Assessment and Plan: * Diverticulitis of colon with perforation CT abdomen and pelvis with concern of diverticulitis and microperforations.  Elevated ESR and CRP along with leukocytosis.  Symptoms improving. -General Surgery is on board and advancing diet -Continue with Zosyn -if remains stable can be discharged on Augmentin . - Continue with supportive care -Patient will need a colonoscopy in 4 to 6 weeks for concern of colonic mass.   Subjective: Patient seen and examined today.  Denies any pain, nausea or vomiting.  Passing flatus, passed little amount  of mucus yesterday, no proper bowel movement yet.  Physical Exam: Vitals:   08/02/24 2024 08/03/24 0316 08/03/24 0500 08/03/24 0842  BP: 131/76 116/65  120/73  Pulse: 92 73  77  Resp: 18 18  18   Temp: 100.3 F (37.9 C) 99 F (37.2 C)  98.1 F (36.7 C)  TempSrc:      SpO2: 100% 99%  97%  Weight:   120 kg   Height:       General.  Obese gentleman, in no acute distress. Pulmonary.  Lungs clear bilaterally, normal respiratory effort. CV.  Regular rate and rhythm, no JVD, rub or murmur. Abdomen.  Soft, nontender, nondistended, BS positive. CNS.  Alert and oriented .  No focal neurologic deficit. Extremities.  No edema,  pulses intact and symmetrical. Psychiatry.  Judgment and insight appears normal.   Data Reviewed: Prior data reviewed  Family Communication: Discussed with patient  Disposition: Status is: Inpatient Remains inpatient appropriate because: Severity of illness  Planned Discharge Destination: Home  DVT prophylaxis.  Lovenox  Time spent: 45 minutes  This record has been created using Conservation officer, historic buildings. Errors have been sought and corrected,but may not always be located. Such creation errors do not reflect on the standard of care.   Author: Amaryllis Dare, MD 08/03/2024 2:56 PM  For on call review [email or web address].  "

## 2024-08-03 NOTE — Discharge Instructions (Addendum)
 In addition to included general post-operative instructions,  Diet: Recommend soft/low fiber diet at home for the next week or so; hand out given. Then recommend transitioning to high fiber diet, maintain hydration, avoid constipation.   Call office [phone] / [phone]) at any time if any questions, worsening pain, fevers/chills, bleeding, drainage from incision site, or other concerns.  ---------------------------------------------------------------------------------------------------------------  [facility] Primary Care Provider List  [facility] [address], [place], [place] [address] [phone]  [facility] at [facility] [address], [place], [place] [address] [phone]  [facility] [address] [place], [place], [place] [address] [phone]  [facility] at [facility] [address], [place], [place] [address] [phone]  [facility] at [facility] [address], [address], [place], [place] [address] [phone]  [facility] [address] [place], [place], [place] [address] [phone]  [facility] [address], [place], [place] [address] [phone]  [facility] [address], [place], [place] [address] [phone]  [facility] [address] [place], [place] [address] [phone]  [facility] [address], [place], [place] [address] [phone]  [facility] [address] [place], [address], [place], [place] [address] [phone]  [facility] [address], [place], [place] [address] [phone]  [facility] at [facility] [address]  [place], [place], [place] [address] [phone]  [facility] at [facility] [address], [place], [place] [address] [phone]  [facility] at [facility] [address], [place], [place] [address] [phone]

## 2024-08-03 NOTE — Assessment & Plan Note (Signed)
 CT abdomen and pelvis with concern of diverticulitis and microperforations.  Elevated ESR and CRP along with leukocytosis.  Symptoms improving. -General Surgery is on board and advancing diet -Continue with Zosyn -if remains stable can be discharged on Augmentin . - Continue with supportive care -Patient will need a colonoscopy in 4 to 6 weeks for concern of colonic mass.

## 2024-08-03 NOTE — Progress Notes (Signed)
 [facility] SURGICAL PROGRESS NOTE (cpt [phone])  Hospital Day(s): 1.   Interval History: Patient seen and examined, no acute events or new complaints overnight. Patient reports he feels a lot better. He reports very minimal lower abdominal soreness but nothing like presentation. No fever, chills, nausea, emesis. Leukocytosis making improvements; WBC 10.9K. Hgb to 12.4. Renal function normal; sCr - 1.11; UO - unmeasured. He is on CLD; only doing water, wanted to fast. Continues on Zosyn .   Review of Systems:  Constitutional: denies fever, chills  HEENT: denies cough or congestion  Respiratory: denies any shortness of breath  Cardiovascular: denies chest pain or palpitations  Gastrointestinal: denies abdominal pain, N/V Genitourinary: denies burning with urination or urinary frequency Musculoskeletal: denies pain, decreased motor or sensation  Vital signs in last 24 hours: [min-max] current  Temp:  [98.2 F (36.8 C)-100.3 F (37.9 C)] 99 F (37.2 C) (01/29 0316) Pulse Rate:  [73-94] 73 (01/29 0316) Resp:  [16-18] 18 (01/29 0316) BP: (116-158)/(65-93) 116/65 (01/29 0316) SpO2:  [99 %-100 %] 99 % (01/29 0316) Weight:  [120 kg-122.5 kg] 120 kg (01/29 0500)     Height: 6' 2 (188 cm) Weight: 120 kg BMI (Calculated): 33.95   Intake/Output last 2 shifts:  01/28 0701 - 01/29 0700 In: 270 [P.O.:220; IV Piggyback:50] Out: -    Physical Exam:  Constitutional: alert, cooperative and no distress  HENT: normocephalic without obvious abnormality  Eyes: PERRL, EOM's grossly intact and symmetric  Respiratory: breathing non-labored at rest  Cardiovascular: regular rate and sinus rhythm  Gastrointestinal: soft, non-tender, and non-distended. No rebound/guarding    Labs:     Latest Ref Rng & Units 08/03/2024    5:51 AM 08/02/2024    9:27 AM 11/09/2022    8:34 AM  CBC  WBC 4.0 - 10.5 K/uL 10.9  11.1  4.1   Hemoglobin 13.0 - 17.0 g/dL 87.5  86.1  83.8   Hematocrit 39.0 - 52.0  % 35.7  40.9  47.1   Platelets 150 - 400 K/uL 283  320  283       Latest Ref Rng & Units 08/03/2024    5:51 AM 08/02/2024    9:27 AM 11/09/2022    8:34 AM  CMP  Glucose 70 - 99 mg/dL 896  862  819   BUN 6 - 20 mg/dL 7  7  12    Creatinine 0.61 - 1.24 mg/dL 8.88  8.90  9.02   Sodium 135 - 145 mmol/L 137  138  137   Potassium 3.5 - 5.1 mmol/L 3.5  3.8  4.0   Chloride 98 - 111 mmol/L 100  101  104   CO2 22 - 32 mmol/L 22  23  23    Calcium 8.9 - 10.3 mg/dL 8.7  9.1  9.2   Total Protein 6.5 - 8.1 g/dL  8.1  8.0   Total Bilirubin 0.0 - 1.2 mg/dL  1.0  0.8   Alkaline Phos 38 - 126 U/L  70  78   AST 15 - 41 U/L  22  32   ALT 0 - 44 U/L  38  43      Imaging studies: No new pertinent imaging studies   Assessment/Plan: 44 y.o. male with diverticulitis with microperforation   - We can advance to FLD; Okay for soft diet for dinner today if does well.    - Continue IV Abx (Zosyn ); Will transition to PO Augmentin  at discharge    - No need for  emergent surgical intervention. He understands that he will benefit from colonoscopy in a few weeks once recovered from this insult.    - Monitor abdominal examination; on-going bowel function - Monitor leukocytosis; Improving - Pain control prn; antiemetics prn - Mobilize - Further management per primary service; we will follow  - Discharge Planning: Clinically doing well. Will advance diet today and monitor tolerance. If he continues to improve, I think he could DC home tomorrow (01/30) with Abx   All of the above findings and recommendations were discussed with the patient, and the medical team, and all of patient's questions were answered to his expressed satisfaction.  -- [name], PA-C [facility] 08/03/2024, 7:24 AM M-F: 7am - 4pm

## 2024-08-03 NOTE — Hospital Course (Addendum)
 Partly taken from H&P.  Caleb Manning is a 44 y.o. year old male with history of diverticulitis presenting to the ED with worsening abdominal pain for 1 week with decreased p.o. intake.  Patient has history of diverticulitis in 2021 requiring hospitalization.  He did get exacerbations almost yearly which were treated at home.  On presentation patient was febrile at 100.6, otherwise stable vitals.  CBC with mild leukocytosis and CMP with mild hyperglycemia, otherwise unremarkable.  UA negative for UTI. CT abdomen pelvis show severe inflammatory changes of the distal sigmoid colon with likely microperforation but no abscess. There is some concern for masslike appearance in the colon and underlying neoplasm is not excluded so follow-up colonoscopy is recommended.   General surgery was consulted and patient was started on Zosyn .  Patient will need outpatient GI evaluation and colonoscopy.  1/29: Vital stable, ESR elevated at 47, WBC 10.9 but all cell lines decreased so likely some dilutional effect, preliminary blood cultures negative. Passing flatus, surgery is advancing diet and if continues to tolerate and had a bowel movement then likely be discharged tomorrow.  1/30: Remained hemodynamically stable, tolerating advancement in diet.  Had a bowel movement.  General surgery cleared him for discharge on 7 days of Augmentin  and he will follow-up with surgery as outpatient for further assistance.

## 2024-08-04 ENCOUNTER — Other Ambulatory Visit: Payer: Self-pay

## 2024-08-04 DIAGNOSIS — K5720 Diverticulitis of large intestine with perforation and abscess without bleeding: Secondary | ICD-10-CM | POA: Diagnosis not present

## 2024-08-04 LAB — CBC
HCT: 37.3 % — ABNORMAL LOW (ref 39.0–52.0)
Hemoglobin: 12.7 g/dL — ABNORMAL LOW (ref 13.0–17.0)
MCH: 32.1 pg (ref 26.0–34.0)
MCHC: 34 g/dL (ref 30.0–36.0)
MCV: 94.2 fL (ref 80.0–100.0)
Platelets: 308 10*3/uL (ref 150–400)
RBC: 3.96 MIL/uL — ABNORMAL LOW (ref 4.22–5.81)
RDW: 11.5 % (ref 11.5–15.5)
WBC: 8.1 10*3/uL (ref 4.0–10.5)
nRBC: 0 % (ref 0.0–0.2)

## 2024-08-04 LAB — BASIC METABOLIC PANEL WITH GFR
Anion gap: 14 (ref 5–15)
BUN: 9 mg/dL (ref 6–20)
CO2: 22 mmol/L (ref 22–32)
Calcium: 8.9 mg/dL (ref 8.9–10.3)
Chloride: 102 mmol/L (ref 98–111)
Creatinine, Ser: 1.04 mg/dL (ref 0.61–1.24)
GFR, Estimated: >60 mL/min
Glucose, Bld: 88 mg/dL (ref 70–99)
Potassium: 3.7 mmol/L (ref 3.5–5.1)
Sodium: 138 mmol/L (ref 135–145)

## 2024-08-04 LAB — MISC LABCORP TEST (SEND OUT): Labcorp test code: 83935

## 2024-08-04 LAB — GLUCOSE, CAPILLARY: Glucose-Capillary: 100 mg/dL — ABNORMAL HIGH (ref 70–99)

## 2024-08-04 MED ORDER — AMOXICILLIN-POT CLAVULANATE 875-125 MG PO TABS
1.0000 | ORAL_TABLET | Freq: Two times a day (BID) | ORAL | 0 refills | Status: AC
  Filled 2024-08-04: qty 14, 7d supply, fill #0

## 2024-08-04 MED ORDER — SENNOSIDES-DOCUSATE SODIUM 8.6-50 MG PO TABS
1.0000 | ORAL_TABLET | Freq: Every evening | ORAL | 0 refills | Status: AC | PRN
  Filled 2024-08-04: qty 30, 30d supply, fill #0

## 2024-08-04 MED ORDER — ONDANSETRON HCL 4 MG PO TABS
4.0000 mg | ORAL_TABLET | Freq: Four times a day (QID) | ORAL | 0 refills | Status: AC | PRN
  Filled 2024-08-04: qty 20, 5d supply, fill #0

## 2024-08-04 NOTE — Progress Notes (Signed)
 [facility] SUR[facility]ICAL PROGRESS NOTE (cpt [phone])  Hospital Day(s): 2.   Interval History: Patient seen and examined, no acute events or new complaints overnight. Patient reports he is without any abdominal pain this morning. No fever, chills, nausea, emesis. No fever, chills, nausea, emesis. Leukocytosis resolved; WBC 8.1K. Hgb to 12.7. Renal function normal; sCr - 1.04; UO - unmeasured. He is on soft diet; no issues. Continues on Zosyn .   Review of Systems:  Constitutional: denies fever, chills  HEENT: denies cough or congestion  Respiratory: denies any shortness of breath  Cardiovascular: denies chest pain or palpitations  Gastrointestinal: denies abdominal pain, N/V Genitourinary: denies burning with urination or urinary frequency Musculoskeletal: denies pain, decreased motor or sensation  Vital signs in last 24 hours: [min-max] current  Temp:  [98.1 F (36.7 C)-98.6 F (37 C)] 98.2 F (36.8 C) (01/30 0814) Pulse Rate:  [73-79] 78 (01/30 0814) Resp:  [16-18] 18 (01/30 0814) BP: (119-142)/(51-84) 124/72 (01/30 0814) SpO2:  [100 %] 100 % (01/30 0814) Weight:  [126.8 kg] 126.8 kg (01/30 0357)     Height: 6' 2 (188 cm) Weight: 126.8 kg BMI (Calculated): 35.88   Intake/Output last 2 shifts:  01/29 0701 - 01/30 0700 In: 207.7 [P.O.:120; IV Piggyback:87.7] Out: -    Physical Exam:  Constitutional: alert, cooperative and no distress  HENT: normocephalic without obvious abnormality  Eyes: PERRL, EOM's grossly intact and symmetric  Respiratory: breathing non-labored at rest  Cardiovascular: regular rate and sinus rhythm  Gastrointestinal: soft, non-tender, and non-distended. No rebound/guarding    Labs:     Latest Ref Rng & Units 08/04/2024    4:39 AM 08/03/2024    5:51 AM 08/02/2024    9:27 AM  CBC  WBC 4.0 - 10.5 K/uL 8.1  10.9  11.1   Hemoglobin 13.0 - 17.0 g/dL 87.2  87.5  86.1   Hematocrit 39.0 - 52.0 % 37.3  35.7  40.9   Platelets 150 - 400 K/uL 308   283  320       Latest Ref Rng & Units 08/04/2024    4:39 AM 08/03/2024    5:51 AM 08/02/2024    9:27 AM  CMP  Glucose 70 - 99 mg/dL 88  896  862   BUN 6 - 20 mg/dL 9  7  7    Creatinine 0.61 - 1.24 mg/dL 8.95  8.88  8.90   Sodium 135 - 145 mmol/L 138  137  138   Potassium 3.5 - 5.1 mmol/L 3.7  3.5  3.8   Chloride 98 - 111 mmol/L 102  100  101   CO2 22 - 32 mmol/L 22  22  23    Calcium 8.9 - 10.3 mg/dL 8.9  8.7  9.1   Total Protein 6.5 - 8.1 g/dL   8.1   Total Bilirubin 0.0 - 1.2 mg/dL   1.0   Alkaline Phos 38 - 126 U/L   70   AST 15 - 41 U/L   22   ALT 0 - 44 U/L   38      Imaging studies: No new pertinent imaging studies   Assessment/Plan: 44 y.o. male with diverticulitis with microperforation   - Okay to continue diet as tolerated - Recommend soft diet at home for the next week or so, then transition to high fiber diet, avoid constipation, maintain hydration   - Continue IV Abx (Zosyn ); Will transition to PO Augmentin  at discharge for 7 days   - No need for  emergent surgical intervention. He understands that he will benefit from colonoscopy in a few weeks once recovered from this insult.    - Monitor abdominal examination; on-going bowel function - Monitor leukocytosis; resolved - Pain control prn; antiemetics prn - Mobilize - Further management per primary service; we will follow  - Discharge Planning: Okay for DC from surgical perspective. Will update follow up and instructions   All of the above findings and recommendations were discussed with the patient, and the medical team, and all of patient's questions were answered to his expressed satisfaction.  -- [name], PA-C [facility] 08/04/2024, 9:12 AM M-F: 7am - 4pm

## 2024-08-04 NOTE — TOC CM/SW Note (Signed)
" ° ° °  TOC received a call from Izetta Moor Nurse Case Manager with [facility] [phone] ext [phone], she advised to reach out if needed.  "

## 2024-08-04 NOTE — Discharge Summary (Signed)
 " Physician Discharge Summary   Patient: HA PLACERES MRN: [id number] DOB: 1980-08-21  Admit date:     08/02/2024  Discharge date: 08/04/24  Discharge Physician: Amaryllis Dare   PCP: Patient, No Pcp Per   Recommendations at discharge:  Please obtain CBC and BMP and follow-up Please ensure completion of antibiotics Follow-up with general surgery Follow-up with primary care provider  Discharge Diagnoses: Principal Problem:   Diverticulitis of colon with perforation   Hospital Course: Partly taken from H&P.  DIMITRIUS STEEDMAN is a 44 y.o. year old male with history of diverticulitis presenting to the ED with worsening abdominal pain for 1 week with decreased p.o. intake.  Patient has history of diverticulitis in 2021 requiring hospitalization.  He did get exacerbations almost yearly which were treated at home.  On presentation patient was febrile at 100.6, otherwise stable vitals.  CBC with mild leukocytosis and CMP with mild hyperglycemia, otherwise unremarkable.  UA negative for UTI. CT abdomen pelvis show severe inflammatory changes of the distal sigmoid colon with likely microperforation but no abscess. There is some concern for masslike appearance in the colon and underlying neoplasm is not excluded so follow-up colonoscopy is recommended.   General surgery was consulted and patient was started on Zosyn .  Patient will need outpatient GI evaluation and colonoscopy.  1/29: Vital stable, ESR elevated at 47, WBC 10.9 but all cell lines decreased so likely some dilutional effect, preliminary blood cultures negative. Passing flatus, surgery is advancing diet and if continues to tolerate and had a bowel movement then likely be discharged tomorrow.  1/30: Remained hemodynamically stable, tolerating advancement in diet.  Had a bowel movement.  General surgery cleared him for discharge on 7 days of Augmentin  and he will follow-up with surgery as outpatient for further  assistance.  Assessment and Plan: * Diverticulitis of colon with perforation CT abdomen and pelvis with concern of diverticulitis and microperforations.  Elevated ESR and CRP along with leukocytosis.  Symptoms improving. -General Surgery is on board and advancing diet Improving symptoms and having bowel movement.  No more abdominal pain.  Patient received Zosyn  while in the hospital and is being discharged on 7 days of Augmentin  as recommended by general surgery.  She will follow-up with them as outpatient.  Consultants: General surgery Procedures performed: None Disposition: Home Diet recommendation:  Regular diet DISCHARGE MEDICATION: Allergies as of 08/04/2024   No Active Allergies      Medication List     TAKE these medications    amoxicillin -clavulanate 875-125 MG tablet Commonly known as: AUGMENTIN  Take 1 tablet by mouth 2 (two) times daily for 7 days.   ibuprofen 800 MG tablet Commonly known as: ADVIL Take 800 mg by mouth every 8 (eight) hours as needed.   multivitamins ther. w/minerals Tabs tablet Take 1 tablet by mouth daily.   ondansetron  4 MG tablet Commonly known as: ZOFRAN  Take 1 tablet (4 mg total) by mouth every 6 (six) hours as needed for nausea.   senna-docusate 8.6-50 MG tablet Commonly known as: Senokot-S Take 1 tablet by mouth at bedtime as needed for mild constipation.        Follow-up Information     Marinda Jayson KIDD, MD. Schedule an appointment as soon as possible for a visit in 3 week(s).   Specialty: General Surgery Why: follow up in 2-3 weeks; diverticulitis Contact information: [address] [place] [address] [phone]                Discharge  Exam: Filed Weights   08/02/24 0925 08/03/24 0500 08/04/24 0357  Weight: 122.5 kg 120 kg 126.8 kg   General.  Obese gentleman, in no acute distress. Pulmonary.  Lungs clear bilaterally, normal respiratory effort. CV.  Regular rate and rhythm, no JVD, rub or  murmur. Abdomen.  Soft, nontender, nondistended, BS positive. CNS.  Alert and oriented .  No focal neurologic deficit. Extremities.  No edema,  pulses intact and symmetrical. Psychiatry.  Judgment and insight appears normal.   Condition at discharge: stable  The results of significant diagnostics from this hospitalization (including imaging, microbiology, ancillary and laboratory) are listed below for reference.   Imaging Studies: CT ABDOMEN PELVIS W CONTRAST Result Date: 08/02/2024 CLINICAL DATA:  Mid to lower abdominal pain. Clinical history of diverticulitis. EXAM: CT ABDOMEN AND PELVIS WITH CONTRAST TECHNIQUE: Multidetector CT imaging of the abdomen and pelvis was performed using the standard protocol following bolus administration of intravenous contrast. RADIATION DOSE REDUCTION: This exam was performed according to the departmental dose-optimization program which includes automated exposure control, adjustment of the mA and/or kV according to patient size and/or use of iterative reconstruction technique. CONTRAST:  OMNIPAQUE  IOHEXOL  300 MG/ML  SOLN COMPARISON:  Prior CT abdomen/pelvis 08/16/2020 FINDINGS: Lower chest: No acute abnormality. Hepatobiliary: Normal hepatic contour and morphology. No discrete hepatic lesion. Diffuse low attenuation of the hepatic parenchyma with sparing adjacent to the gallbladder fossa consistent with hepatic steatosis. Gallbladder is unremarkable. No intra or extrahepatic biliary ductal dilatation. Pancreas: Unremarkable. No pancreatic ductal dilatation or surrounding inflammatory changes. Spleen: Normal in size without focal abnormality. Adrenals/Urinary Tract: Adrenal glands are unremarkable. Kidneys are normal, without renal calculi, focal lesion, or hydronephrosis. Bladder is unremarkable. Stomach/Bowel: Severe inflammatory changes affecting the distal sigmoid colon at a site of prior diverticulitis. Profound inflammatory stranding is present within the  pericolonic fat. There is a single prominent pericolonic lymph node measuring up to 0.6 cm (image 64 series 2). A few locules of gas are present within the wall or just extraluminal consistent with micro perforation. No evidence of abscess at this time. Vascular/Lymphatic: No significant vascular findings are present. No enlarged abdominal or pelvic lymph nodes. Reproductive: Prostate is unremarkable. Other: No abdominal wall hernia or abnormality. No abdominopelvic ascites. Musculoskeletal: Chronic right L5 pars defect. Left L5-S1 facet arthropathy. No anterolisthesis. No evidence of fracture or osseous lesion. IMPRESSION: 1. Severe inflammatory changes of the distal sigmoid colon the site prior diverticulitis with probable micro perforation in significant pericolonic inflammatory changes/phlegmon but no focal abscess formation at this time. Of note, given recurrent diverticulitis and the masslike appearance of the edema/inflammation, and underlying neoplasm is difficult to exclude by imaging alone. Recommend follow-up colonoscopy after resolution of the acute symptoms to exclude malignancy. 2. Hepatic steatosis. 3. 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 4. Associated left L5-S1 degenerative facet arthropathy. Electronically Signed   By: Wilkie Lent M.D.   On: 08/02/2024 12:17    Microbiology: Results for orders placed or performed during the hospital encounter of 08/02/24  CULTURE, BLOOD (ROUTINE X 2) w Reflex to ID Panel     Status: None (Preliminary result)   Collection Time: 08/02/24  4:01 PM   Specimen: BLOOD  Result Value Ref Range Status   Specimen Description BLOOD BLOOD RIGHT ARM  Final   Special Requests   Final    BOTTLES DRAWN AEROBIC AND ANAEROBIC Blood Culture adequate volume   Culture   Final    NO GROWTH 2 DAYS Performed at [facility], [address] [place], [place] [address]    Report Status PENDING  Incomplete     Labs: CBC: Recent Labs  Lab 08/02/24 0927 08/03/24 0551 08/04/24 0439  WBC 11.1* 10.9* 8.1  HGB 13.8 12.4* 12.7*  HCT 40.9 35.7* 37.3*  MCV 93.6 94.2 94.2  PLT 320 283 308   Basic Metabolic Panel: Recent Labs  Lab 08/02/24 0927 08/02/24 1601 08/03/24 0551 08/04/24 0439  NA 138  --  137 138  K 3.8  --  3.5 3.7  CL 101  --  100 102  CO2 23  --  22 22  GLUCOSE 137*  --  103* 88  BUN 7  --  7 9  CREATININE 1.09  --  1.11 1.04  CALCIUM 9.1  --  8.7* 8.9  MG  --  2.0  --   --    Liver Function Tests: Recent Labs  Lab 08/02/24 0927  AST 22  ALT 38  ALKPHOS 70  BILITOT 1.0  PROT 8.1  ALBUMIN 4.6   CBG: Recent Labs  Lab 08/03/24 0843 08/04/24 0817  GLUCAP 127* 100*    Discharge time spent: greater than 30 minutes.  This record has been created using Conservation officer, historic buildings. Errors have been sought and corrected,but may not always be located. Such creation errors do not reflect on the standard of care.   Signed: Amaryllis Dare, MD [facility] 08/04/2024 "

## 2024-08-04 NOTE — Plan of Care (Signed)

## 2024-08-04 NOTE — Progress Notes (Signed)
 All discharge instructions given to patient. No questions or concerns verbalized by patient. All meds sent to [facility] for delivery at bedside. IV discontinued without complications.

## 2024-08-07 LAB — CULTURE, BLOOD (ROUTINE X 2)
Culture: NO GROWTH
Special Requests: ADEQUATE

## 2024-08-17 ENCOUNTER — Inpatient Hospital Stay: Admitting: General Surgery
